# Patient Record
Sex: Female | Born: 1985
Health system: Southern US, Community
[De-identification: ages and names within clinical notes are randomized; demographics above are authoritative.]

## PROBLEM LIST (undated history)

## (undated) ENCOUNTER — Inpatient Hospital Stay (HOSPITAL_COMMUNITY): Payer: Self-pay

## (undated) HISTORY — PX: TUBAL LIGATION: SHX77

## (undated) HISTORY — PX: TIBIA FRACTURE SURGERY: SHX806

---
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
## 2005-06-19 ENCOUNTER — Emergency Department (HOSPITAL_COMMUNITY): Admission: EM | Admit: 2005-06-19 | Discharge: 2005-06-19 | Payer: Self-pay | Admitting: Emergency Medicine

## 2005-12-02 ENCOUNTER — Emergency Department (HOSPITAL_COMMUNITY): Admission: EM | Admit: 2005-12-02 | Discharge: 2005-12-02 | Payer: Self-pay | Admitting: Emergency Medicine
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
## 2006-03-12 ENCOUNTER — Inpatient Hospital Stay (HOSPITAL_COMMUNITY): Admission: AD | Admit: 2006-03-12 | Discharge: 2006-03-12 | Payer: Self-pay | Admitting: Obstetrics & Gynecology

## 2006-04-17 ENCOUNTER — Inpatient Hospital Stay (HOSPITAL_COMMUNITY): Admission: AD | Admit: 2006-04-17 | Discharge: 2006-04-18 | Payer: Self-pay | Admitting: Obstetrics and Gynecology

## 2006-06-22 ENCOUNTER — Inpatient Hospital Stay (HOSPITAL_COMMUNITY): Admission: AD | Admit: 2006-06-22 | Discharge: 2006-06-22 | Payer: Self-pay | Admitting: Obstetrics and Gynecology

## 2006-08-18 ENCOUNTER — Inpatient Hospital Stay (HOSPITAL_COMMUNITY): Admission: AD | Admit: 2006-08-18 | Discharge: 2006-08-18 | Payer: Self-pay | Admitting: Obstetrics and Gynecology

## 2006-08-31 ENCOUNTER — Inpatient Hospital Stay (HOSPITAL_COMMUNITY): Admission: AD | Admit: 2006-08-31 | Discharge: 2006-08-31 | Payer: Self-pay | Admitting: Obstetrics and Gynecology

## 2006-09-08 ENCOUNTER — Observation Stay (HOSPITAL_COMMUNITY): Admission: AD | Admit: 2006-09-08 | Discharge: 2006-09-09 | Payer: Self-pay | Admitting: Obstetrics and Gynecology

## 2006-09-09 IMAGING — US US OB LIMITED
1 series · 14 of 18 positions shown · non-contrast
Comparison: none

OBSTETRICAL ULTRASOUND:

 This ultrasound exam was performed in the [HOSPITAL] Ultrasound Department.  The OB US report was generated in the AS system, and faxed to the ordering physician.  This report is also available in [REDACTED] PACS.

[Series 1: us ob limited · 0.30mm/px · 14 of 18 slices shown]
[im 1/18]
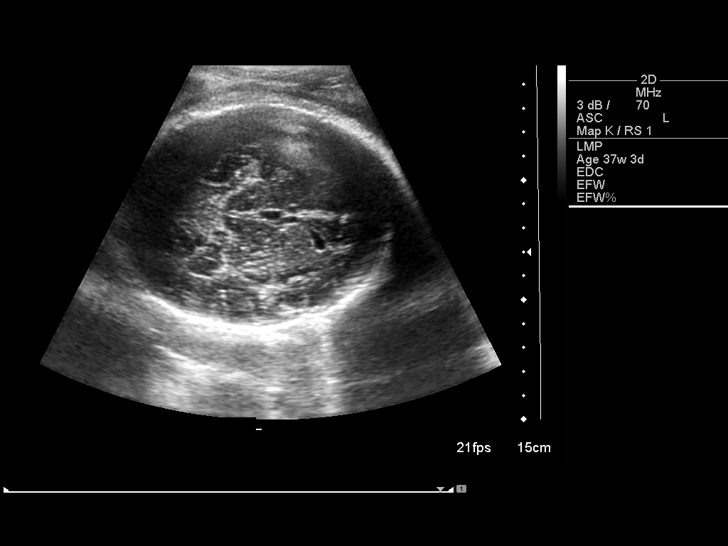
[im 2/18]
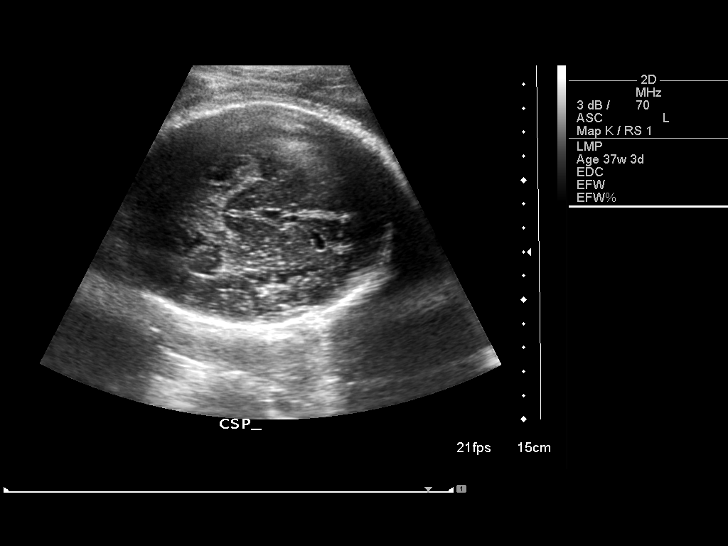
[im 4/18]
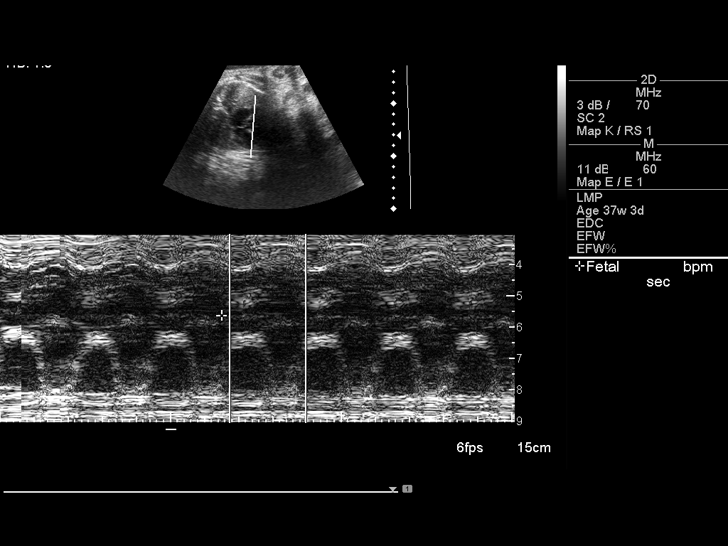
[im 5/18]
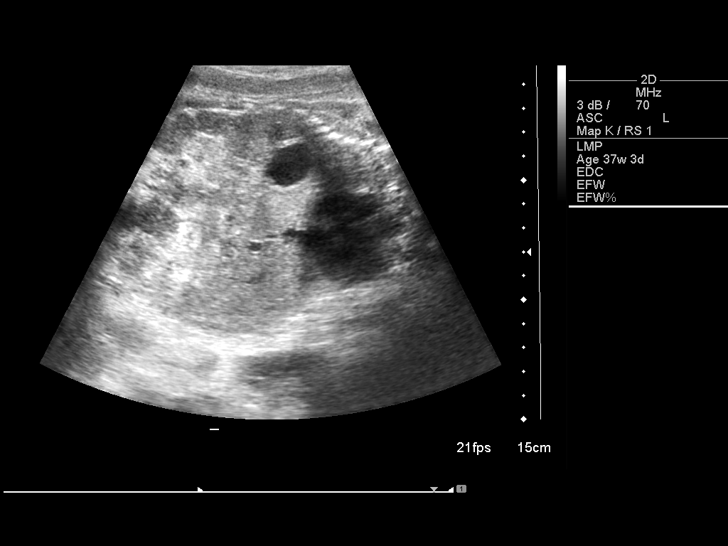
[im 6/18]
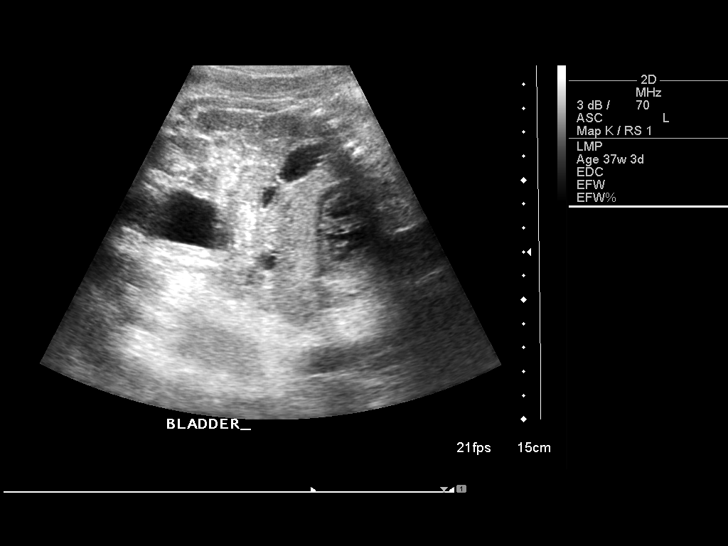
[im 8/18]
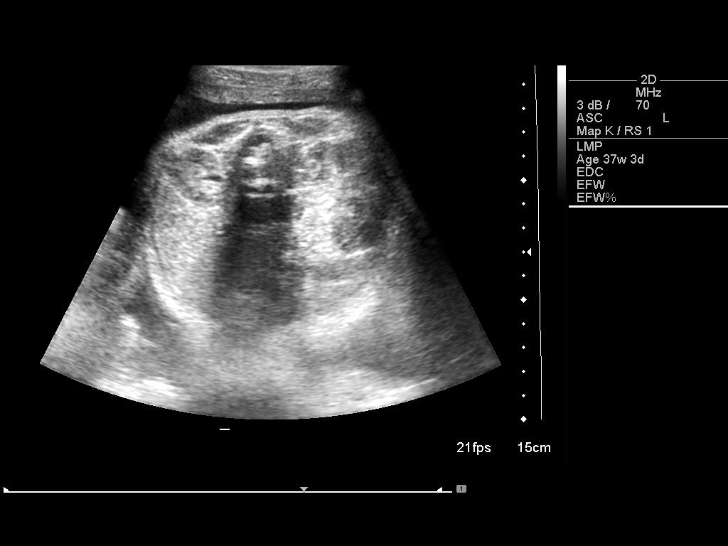
[im 9/18]
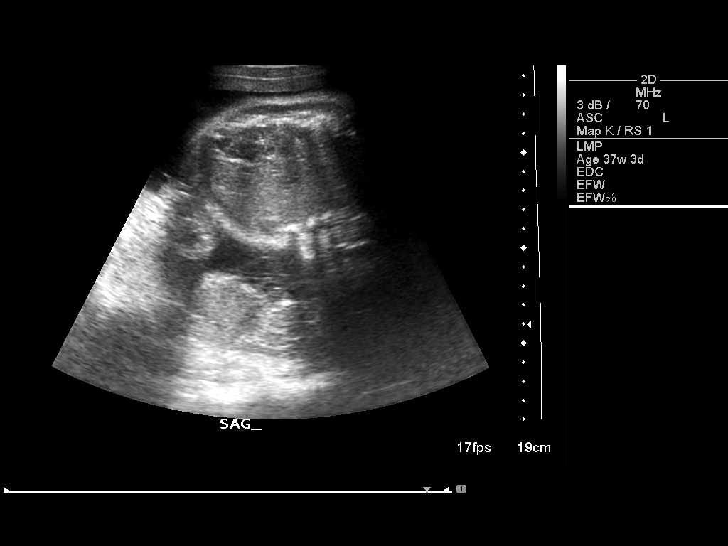
[im 10/18]
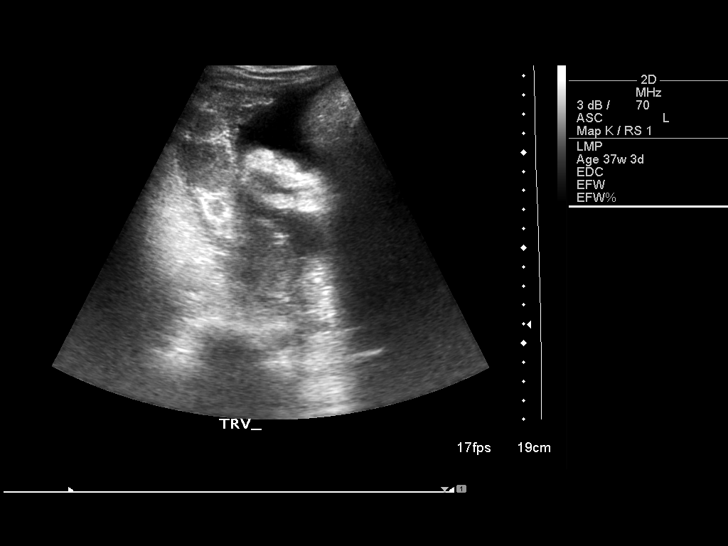
[im 11/18]
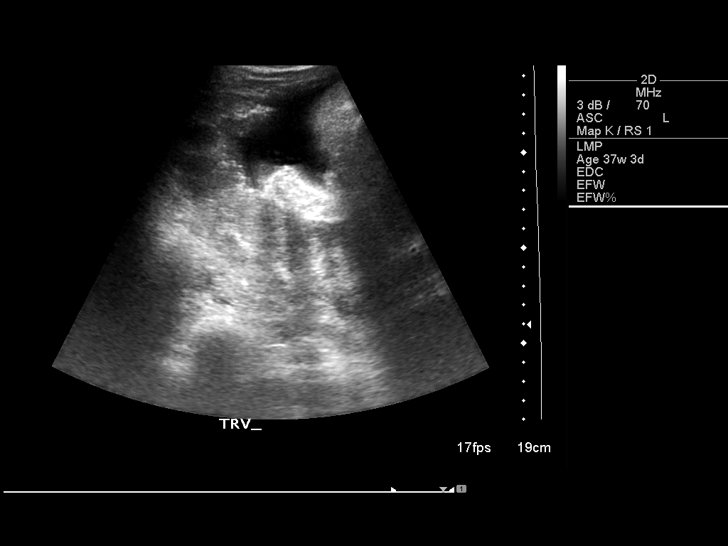
[im 13/18]
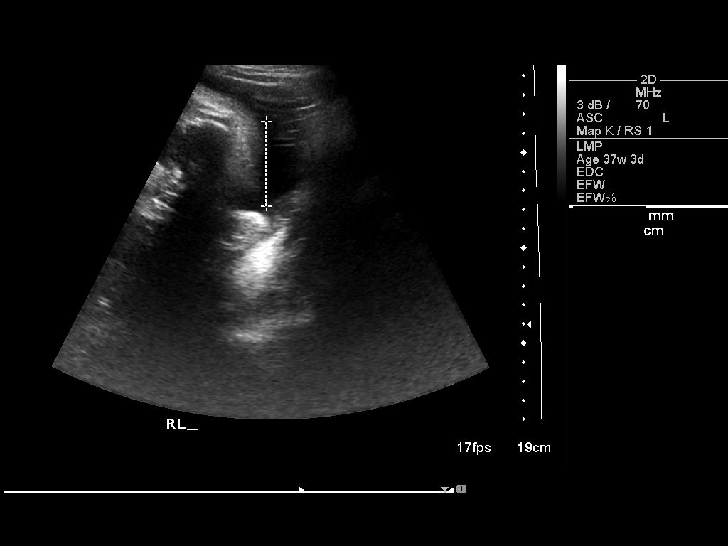
[im 14/18]
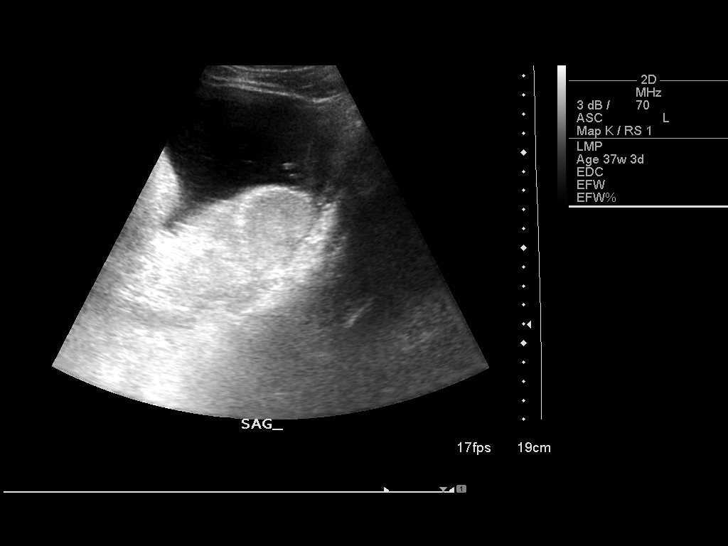
[im 15/18]
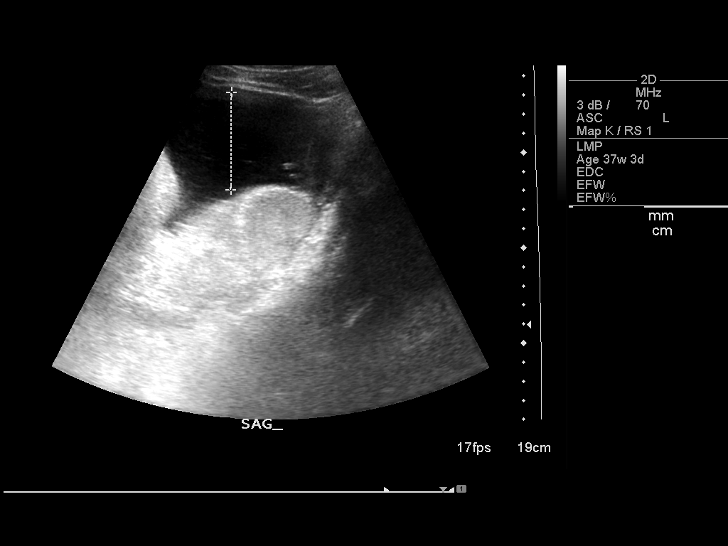
[im 17/18]
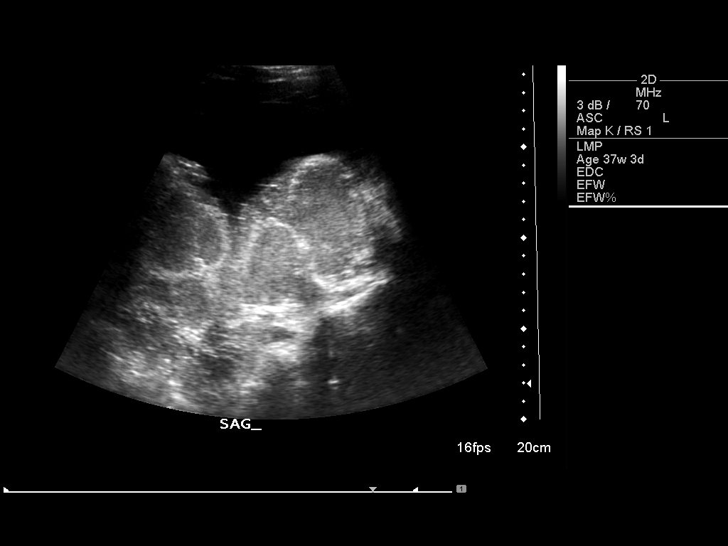
[im 18/18]
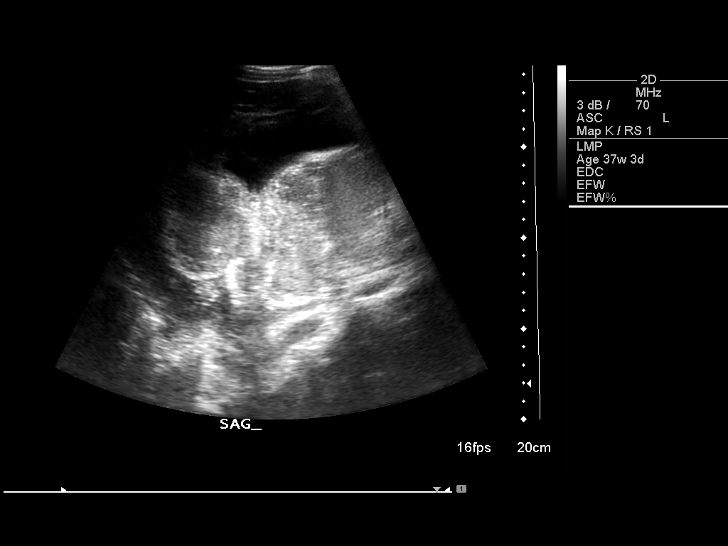

[14 of 18 positions shown; findings below may reference images not displayed]

IMPRESSION: See AS Obstetric US report.

## 2006-09-30 ENCOUNTER — Inpatient Hospital Stay (HOSPITAL_COMMUNITY): Admission: AD | Admit: 2006-09-30 | Discharge: 2006-09-30 | Payer: Self-pay | Admitting: Obstetrics and Gynecology

## 2006-09-30 IMAGING — US US FETAL BPP W/O NONSTRESS
1 series · 14 of 24 positions shown · non-contrast
Comparison: none

OBSTETRICAL ULTRASOUND:

 This ultrasound exam was performed in the [HOSPITAL] Ultrasound Department.  The OB US report was generated in the AS system, and faxed to the ordering physician.  This report is also available in [REDACTED] PACS.

[Series 1: us fetal bpp w/o nonstress · non-contrast · 24 acquisitions, 14 frames shown]
[im 1/24]
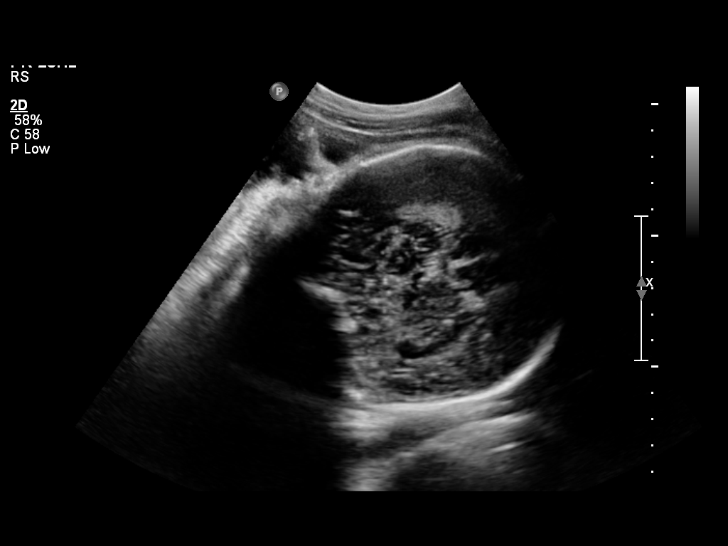
[im 3/24]
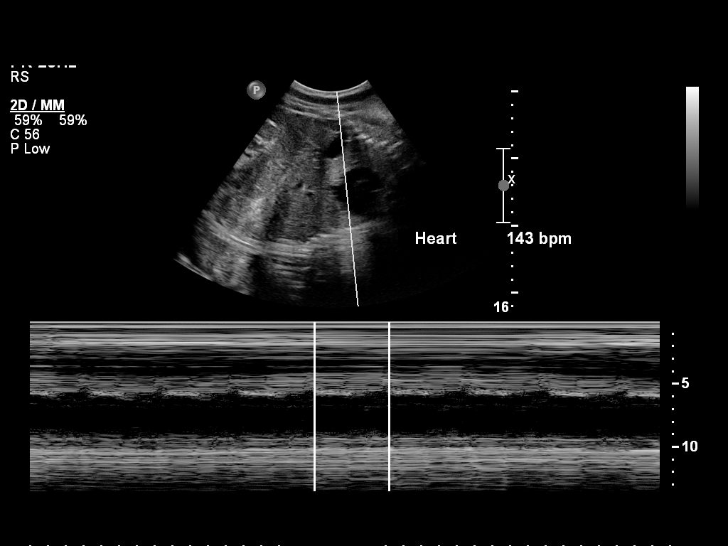
[im 5/24]
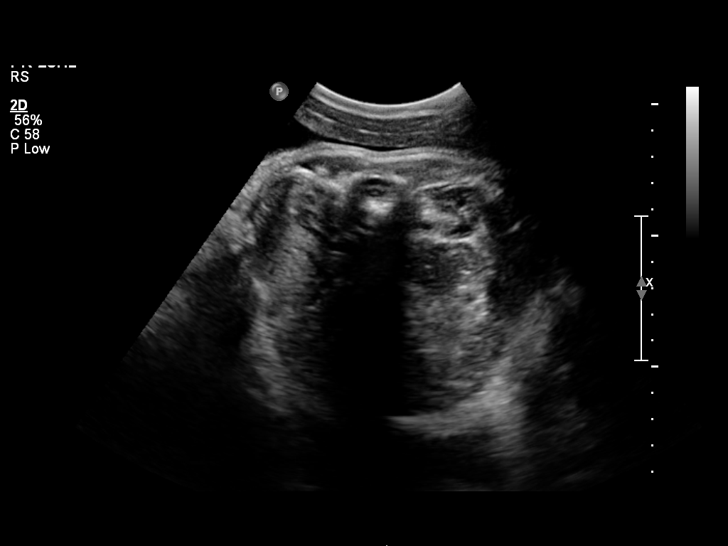
[im 7/24]
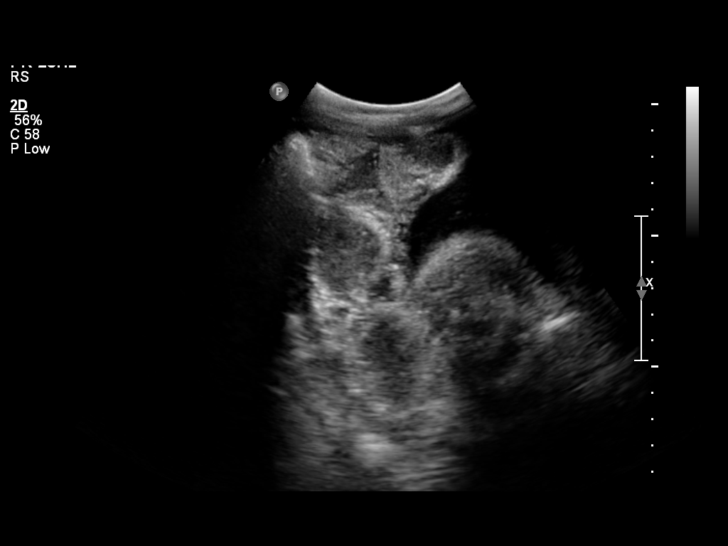
[im 8/24]
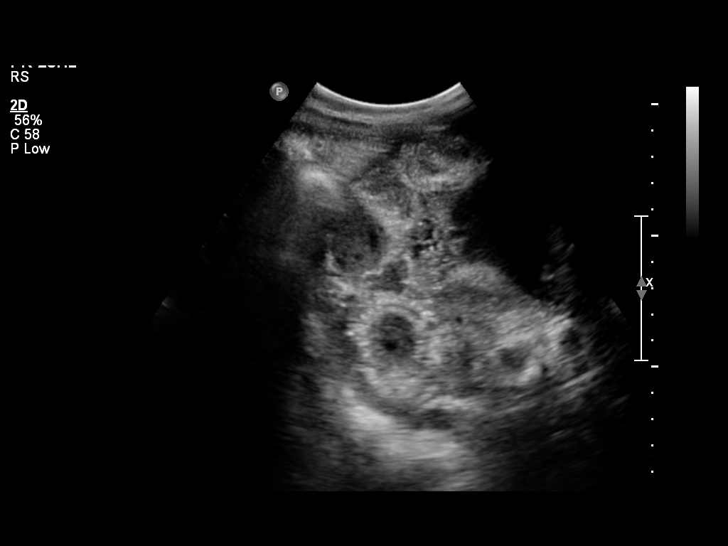
[im 10/24]
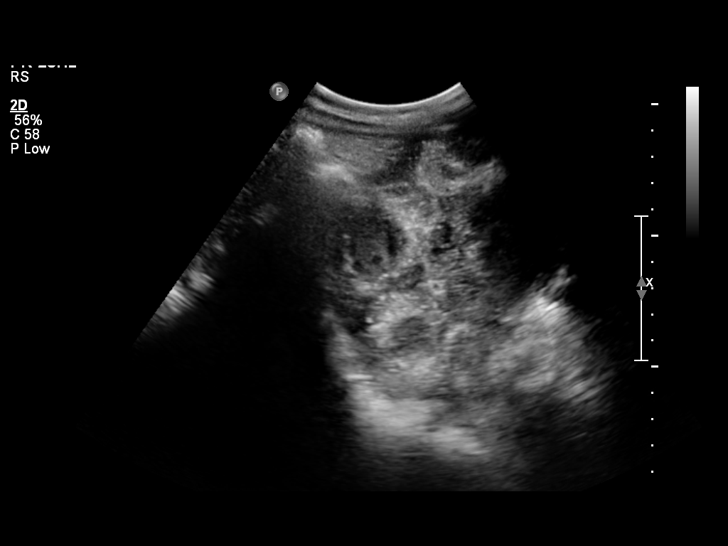
[im 12/24]
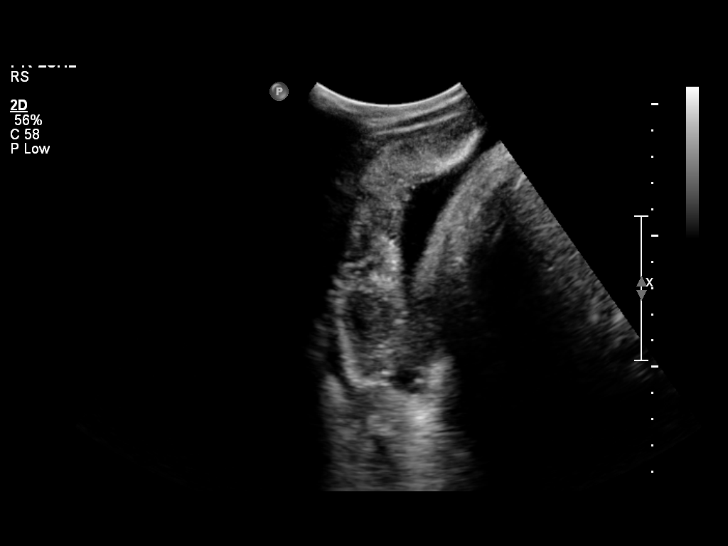
[im 13/24]
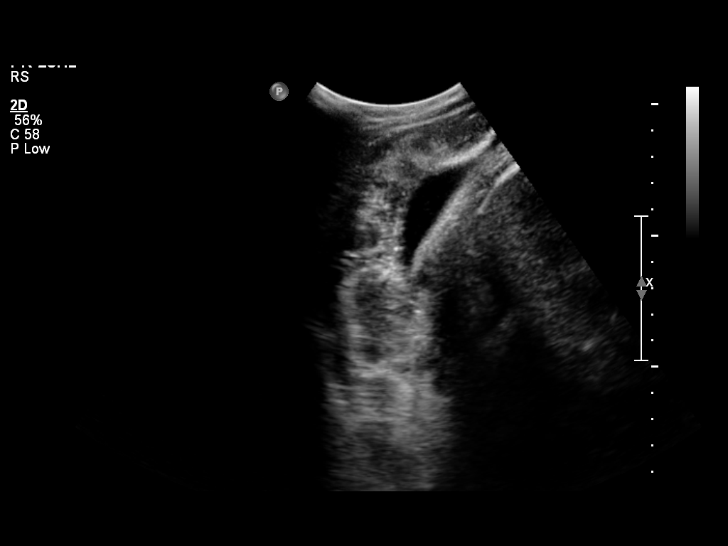
[im 15/24]
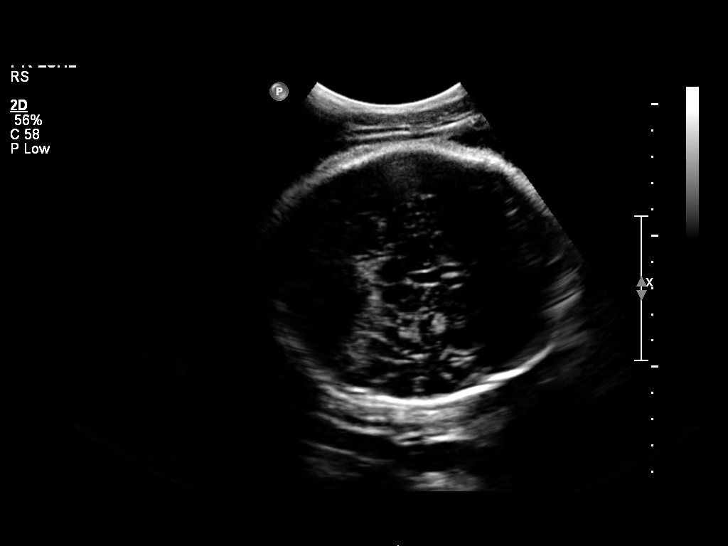
[im 17/24]
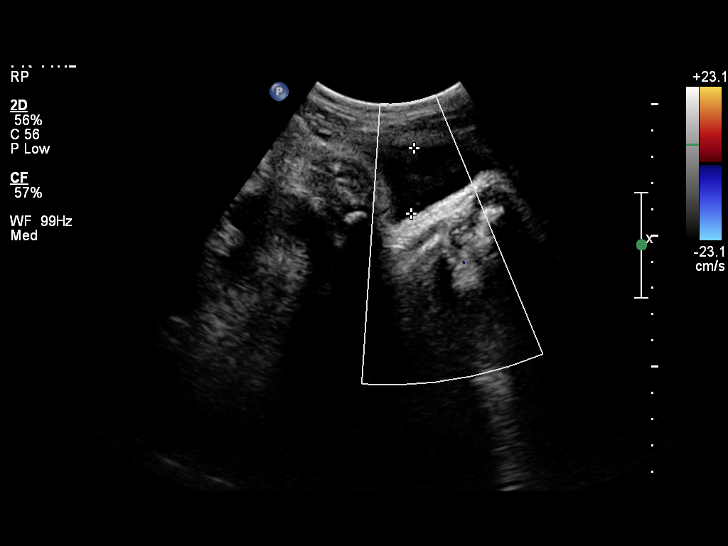
[im 19/24]
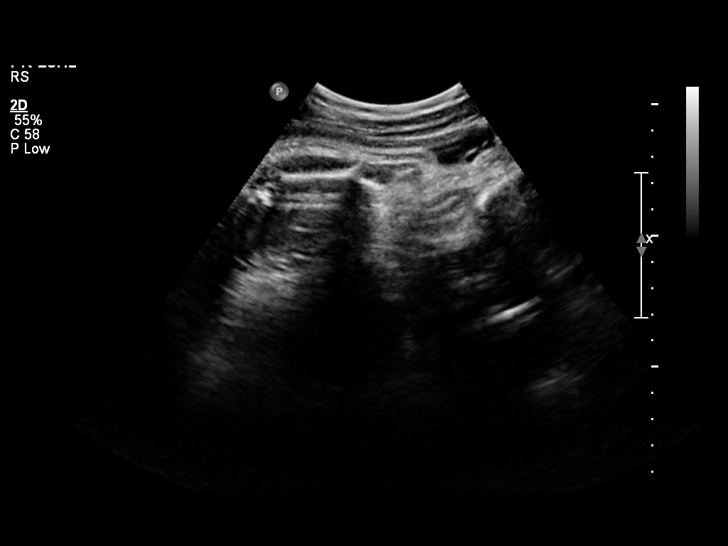
[im 20/24]
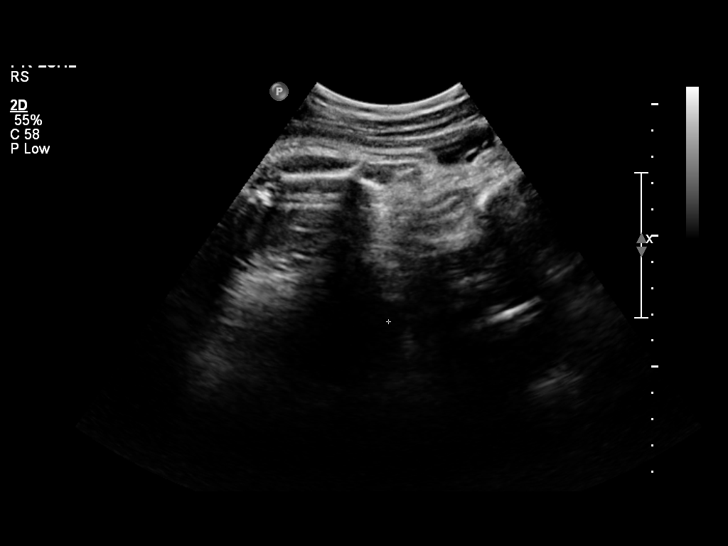
[im 22/24]
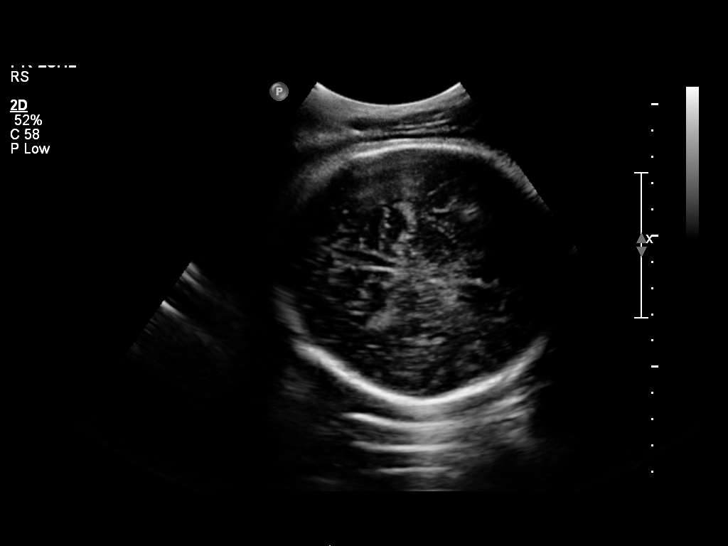
[im 24/24]
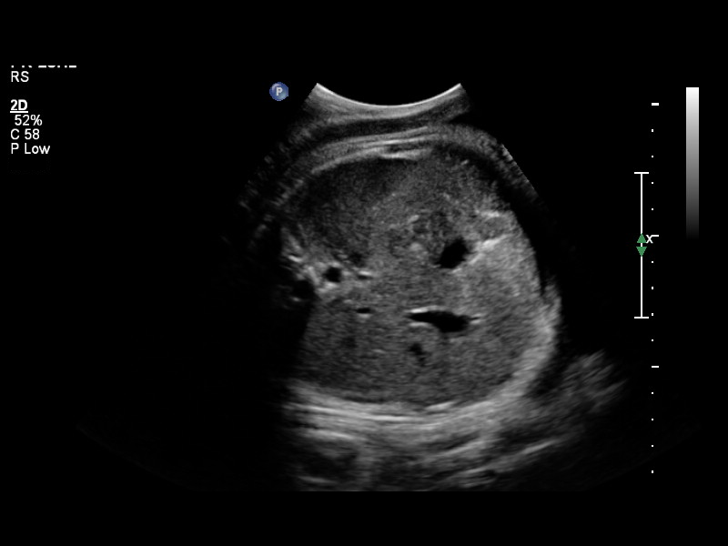

[14 of 24 positions shown; findings below may reference images not displayed]

IMPRESSION: See AS Obstetric US report.

## 2006-10-05 ENCOUNTER — Inpatient Hospital Stay (HOSPITAL_COMMUNITY): Admission: AD | Admit: 2006-10-05 | Discharge: 2006-10-05 | Payer: Self-pay | Admitting: Obstetrics and Gynecology

## 2006-10-06 ENCOUNTER — Inpatient Hospital Stay (HOSPITAL_COMMUNITY): Admission: AD | Admit: 2006-10-06 | Discharge: 2006-10-09 | Payer: Self-pay | Admitting: Obstetrics and Gynecology

## 2006-10-07 ENCOUNTER — Encounter (INDEPENDENT_AMBULATORY_CARE_PROVIDER_SITE_OTHER): Payer: Self-pay | Admitting: Obstetrics and Gynecology

## 2007-04-23 ENCOUNTER — Emergency Department (HOSPITAL_COMMUNITY): Admission: EM | Admit: 2007-04-23 | Discharge: 2007-04-23 | Payer: Self-pay | Admitting: Emergency Medicine

## 2007-04-25 ENCOUNTER — Emergency Department (HOSPITAL_COMMUNITY): Admission: EM | Admit: 2007-04-25 | Discharge: 2007-04-25 | Payer: Self-pay | Admitting: Emergency Medicine

## 2010-10-05 NOTE — Discharge Summary (Signed)
NAMELYRICK, WORLAND              ACCOUNT NO.:  [id number]   MEDICAL RECORD NO.:  [id number]          PATIENT TYPE:  INP   LOCATION:  9129                          FACILITY:  [facility]   PHYSICIAN:  Crist Fat. Rivard, M.D. DATE OF BIRTH:  1986-04-13   DATE OF ADMISSION:  10/06/2006  DATE OF DISCHARGE:  10/09/2006                               DISCHARGE SUMMARY   ADMISSION DIAGNOSES:  1. Intrauterine pregnancy at 40-1/7 weeks.  2. Pregerminal level with fatigue.   DISCHARGE DIAGNOSES:  1. Intrauterine pregnancy at term.  2. Rested dilation.  3. Thick meconium.  4. Body cord.  5. Chorioamnionitis.   PROCEDURES:  Primary low tranverse Cesarean section.   HOSPITAL COURSE:  [name] is a 25 year old Gravida 1, Para 0 at 79-  1/7 weeks who presented with prolonged pregerminal labor on the  afternoon of Oct 06, 2006.  The pregnancy had been remarkable for:  1.  Preterm contractions, and 2)  Positive Group B Strep.   On admission, the cervix was 2-3, 85-90% with a vertex at the minus 2  station.  Contractions were every 2-5 minutes.  Fetal monitor showed  nonreactive fetal heart rate with early deceleration.  Penicillin  prophylaxis was begun.  Pitocin augmentation was also begun.  Epidural  was placed.  Artificial ruptured membranes was accomplished the  afternoon of Oct 06, 2006, where the cervix was 4-5, 100% vertex at a  minus 2 station.  Thick meconium-stained fluid was noted.  An IUPC was  placed, and amnio infusion was started.  Vital signs were within normal  limits.  About 8:30 p.m. the patient had had  deceleration x2 minutes to  the 100s with good recovery.  It occurred to upset her nausea and  vomiting.  She did have some labile blood pressures.  Urine was negative  for protein.  At 9:50 p.m., blood pressure was 140/91.  Fetal heart rate  was reactive.  Monitor __________ were showing 150.  Cervix was 6, 100%  vertex at a minus 2 station with some swelling noted.  Pitocin  was  continued to help to achieve adequate __________ unit.  At 11 p.m., the  cervix had not changed.  Fetal heart rate was showing some occasional  light decelerations and deep variables.  NST tended to become non  reassuring when Pitocin was started after discontinuance.  Decision was  made to discontinue the Pitocin and proceed with C-section.  The patient  and her family were in agreement with this plan.   The patient was taken to the operating room by Dr. Normand Sloop where a  primary low-transverse Casserian section with a 2-layer closure was  performed under existing epidural anesthesia.  Findings were a viable  female, weight 8 pounds, 12 ounces.  APGARs were 4 and 7.  RTO pH was  7.18.  Venous was 7.26.  There was thick meconium noted.  The patient  was taken to recovery in good condition.  Infant was taken to the full-  term nursery.   On late postop Day 0, the patient was doing well; however, late that  evening,  she spiked a temperature of 101.0.  She did have some uterine  tenderness.  The uterus was firm.  No other etiology of infection was  noted.  Unasyn was begin.  CBC was done showing hemoglobin of 8.3 down  from 9.4, white blood cell count of 13 up from 8.2, and platelet count  of 240.  Tylenol was given p.r.n. for temperature.  By postop Day 1, the  patient was doing well.  She was bottle feeding.  She had a temperature  max of 101.8 at 9:20 p.m. on the evening of May 17.  She had been  afebrile since that time after Unasyn had been begun.  Urine culture was  sent.  Physical exam was within normal limits.  The patient was up ab  lib without any syncope or dizziness.  By postop Day 2, the patient was  continuing to do well.  She did desire discharge.  She had no dizziness,  syncope, chills, or fever.  She had been afebrile for 36 hours.  Her  physical exam was within normal limits.  Her incision looked clean, dry,  and intact.  Her abdomen was soft; it was appropriately  mildly tender.  Urine culture was not resulted yet.  Dr. Estanislado Pandy was consulted about the  patient's desire for discharge, and the decision was made to send her  home.   DISCHARGE INSTRUCTIONS:  The patient is to take her temperature twice a  day for 2 days, and to report any temperature greater than or equal to  100.4.  Discharge instructions will also include [facility]  handout.   DISCHARGE MEDICATIONS:  Motrin 600 mg p.o. q.6h. p.r.n. pain, Percocet  5/325 one to two p.o. q.3-4h. p.r.n. pain, Ortho-Tri-Cyclen low 1 p.o.  daily which she may start in 1-2 weeks.   FOLLOW UP:  Discharge follow up will occur in 6 weeks at the [facility] or p.r.n.      Renaldo Reel Emilee Hero, C.N.M.      Crist Fat Rivard, M.D.  Electronically Signed    VLL/MEDQ  D:  10/09/2006  T:  10/09/2006  Job:  [id number]

## 2010-10-05 NOTE — H&P (Signed)
NAMELAPORSCHE, HOEGER              ACCOUNT NO.:  [id number]   MEDICAL RECORD NO.:  [id number]          PATIENT TYPE:  INP   LOCATION:  9167                          FACILITY:  [facility]   PHYSICIAN:  Naima A. Dillard, M.D. DATE OF BIRTH:  [birth year]   DATE OF ADMISSION:  10/06/2006  DATE OF DISCHARGE:                              HISTORY & PHYSICAL   This is a 25 year old gravida 1, para 0 at 40-1/7 weeks who presents  with contractions every 2 to 5 minutes for 24 hours. She has been  vomiting last night and unable to sleep despite Ambien. Pregnancy has  been followed by Dr. Pennie Rushing and remarkable for:  1. Preterm contractions.  2. Group B strep positive.   ALLERGIES:  None.   OBSTETRICAL HISTORY:  The patient is a primigravida.   MEDICAL HISTORY:  Remarkable for childhood varicella.   SURGICAL HISTORY:  Remarkable for surgery to repair a broken tibia in  2001.   FAMILY HISTORY:  Remarkable for an aunt with hypertension, mother with  diabetes and aunt with breast cancer.   GENETIC HISTORY:  Remarkable for an uncle with mental retardation and  father of the baby who is a twin.   SOCIAL HISTORY:  The patient is single. Father of the baby, Demond  Hamlett, is involved and supportive. She is of the [employer]. She  denies any alcohol, tobacco or drug use.   PRENATAL LABORATORY DATA:  Hemoglobin 10.9, platelets 319, blood type O  positive, antibiotic screen negative, sickle cell negative, RPR  nonreactive, rubella immune, hepatitis negative, HIV negative, Pap test  normal, gonorrhea negative, Chlamydia negative, cystic fibrosis  negative.   HISTORY OF CURRENT PREGNANCY:  The patient entered care at 36 weeks'  gestation. She had an ultrasound to confirm dates. She had another  ultrasound at 19 weeks which was normal. Quad screen was normal. Glucola  at 28 weeks was 118. She had some contractions at 32 weeks, had a  negative fetal fibronectin and 50% effacement but no dilation.  She was  placed on terbutaline for p.r.n. use. She came up with a group B strep  that was positive at 33 weeks and was treated with penicillin secondary  to persistent contractions, and she presents today in labor.   OBJECTIVE DATA:  VITAL SIGNS:  Stable, afebrile.  HEENT:  Within normal limits. Thyroid normal and not enlarged.  CHEST:  Clear to auscultation.  HEART:  Regular rate and rhythm.  ABDOMEN:  Gravid. Vertex Leopold's. CFM shows nonreactive fetal heart  rate with early decelerations. Uterine contractions are every 2 to 5  minutes. Cervix is 2 to 3, 85-90% effaced and -2 station, vertex  presentation.  EXTREMITIES:  Within normal limits.   ASSESSMENT:  1. Intrauterine pregnancy at 40-1/2 weeks.  2. Prodromal labor with fatigue.   PLAN:  1. Admit per Dr. Normand Sloop (the patient was scheduled for induction      tomorrow morning).  2. Routine M.D. orders.  3. Penicillin prophylaxis.  4. Pitocin augmentation.  5. Call M.D. after Pitocin was started.      Marie L. Williams, C.N.M.  Naima A. Normand Sloop, M.D.  Electronically Signed    MLW/MEDQ  D:  10/06/2006  T:  10/06/2006  Job:  [id number]

## 2010-10-05 NOTE — Op Note (Signed)
Felicia Vasquez, Felicia Vasquez              ACCOUNT NO.:  [id number]   MEDICAL RECORD NO.:  [id number]          PATIENT TYPE:  INP   LOCATION:  9129                          [facility]ACILITY[facility]   PHYSICIAN:  Felicia A. Dillard, M.D. DATE OF BIRTH:  04-24-1986   DATE OF PROCEDURE:  10/07/2006  DATE OF DISCHARGE:                               OPERATIVE REPORT   PREOPERATIVE DIAGNOSES:  1. Intrauterine pregnancy at term.  2. Arrested dilation.  3. Thick meconium.  4. Nonreassuring heart tones.   POSTOPERATIVE DIAGNOSES:  1. Intrauterine pregnancy at term.  2. Arrested dilation.  3. Thick meconium.  4. Nonreassuring heart tones.  5. Body cord around the baby.   PROCEDURE:  Primary low transverse Cesarean section with 2 layer  closure.   ANESTHESIA:  Epidural.   SURGEON:  Dr. Normand Vasquez.   ASSISTANT:  Marie L. Williams, C.N.M.   ESTIMATED BLOOD LOSS:  500 mL.   URINE OUTPUT:  310 mL clear urine at the end of the procedure.   INTRAVENOUS FLUIDS:  1800 mL crystalloid.   There were no complications. Placenta was sent to pathology.   FINDINGS:  Female infant in vertex presentation with thick meconium with a  body cord, born at 58, Apgars of 4 and 7 with a weight of 4 pounds 8  ounces. Arterial cord pH was 7.18. Venous cord pH was 7.26. Normal-  appearing anatomy. The patient went to the recovery room in stable  condition.   PROCEDURE IN DETAIL:  The patient was taken to the operating room where  her epidural anesthesia was found to be adequate. She was in dorsal  supine position with a left lateral tilt. A Pfannenstiel skin incision  was made with the scalpel and carried down to the fascia, using the  Bovie cautery, the fascia was incised in the midline and extended  bilaterally. Kochers x2 placed in the superior aspect of the fascia  which was dissected off of the rectus muscle both sharply and bluntly.  The inferior aspect of the fascia was dissected in a similar fashion.  Rectus muscles  were separated in the midline. Peritoneum was identified,  tented up and entered sharply and extended sharply with good  visualization of bowel and bladder. The bladder blade was inserted. The  vesicouterine peritoneum was identified, tented up and entered sharply  and dissected on both sides. A bladder flap was created digitally and  bladder blade was reinserted. Primary low transverse uterine incision  was made with the scalpel and extended bluntly. The infant's head was  delivered without difficulty. Baby did have kind of a body/shoulder cord  which was reduced easily x1. Mouth and nares were suctioned with DeLee  suction. Body was delivered. Cord was clamped and cut. The infant was  handed over to the waiting pediatrician. The infant had green meconium  all over his body. Cord gases were obtained. The placenta was delivered.  The uterus was cleared of all clots and debris. The uterine incision was  repaired with 0 Vicryl in a running locked fashion. A second layer of 0  Vicryl was used to imbricate  the uterus. There was a little bit of  oozing from the bladder flap which was made hemostatic with Bovie  cautery and Gelfoam. Irrigation was done. Hemostasis was noted. The  patient had normal-appearing uterus, tubes and ovaries and normal-  appearing abdominal anatomy. The peritoneum was closed with 0 chromic.  The muscles were irrigated and noted to be hemostatic. The fascia was  closed with 0 Vicryl in a running fashion. The subcutaneous layer was  closed with 2-0 plain, and the skin was closed with 3-0 Monocryl in a  subcuticular fashion. Sponge, lap and needle counts were correct. The  patient went to the recovery room in stable condition.      Felicia Vasquez, M.D.  Electronically Signed     NAD/MEDQ  D:  10/07/2006  T:  10/07/2006  Job:  [id number]

## 2010-10-08 ENCOUNTER — Inpatient Hospital Stay (INDEPENDENT_AMBULATORY_CARE_PROVIDER_SITE_OTHER)
Admission: RE | Admit: 2010-10-08 | Discharge: 2010-10-08 | Disposition: A | Discharge status: Home / Self Care | Payer: 59 | Source: Ambulatory Visit | Attending: Emergency Medicine | Admitting: Emergency Medicine

## 2010-10-08 DIAGNOSIS — J029 Acute pharyngitis, unspecified: Secondary | ICD-10-CM

## 2010-10-08 LAB — POCT PREGNANCY, URINE: Preg Test, Ur: NEGATIVE

## 2010-10-08 NOTE — H&P (Signed)
Felicia Vasquez, Felicia Vasquez              ACCOUNT NO.:  [id number]   MEDICAL RECORD NO.:  [id number]          PATIENT TYPE:  OBV   LOCATION:  9149                          [facility]ILITY[facility]   PHYSICIAN:  Hal Morales, M.D.DATE OF BIRTH:  1985-07-05   DATE OF ADMISSION:  09/08/2006  DATE OF DISCHARGE:                              HISTORY & PHYSICAL   This is a 25 year old gravida 1, para 0 at 36-0/7 weeks who presents  status post a fall onto her abdomen and hands at home.  She was vomiting  and felt faint and then fell forward.  There is positive fetal movement  and no bleeding.  The pregnancy has been followed by Dr. Pennie Rushing and  remarkable for:  1)  Group B strep positive.  2)  Preterm labor.  3)  Third trimester UTI.   ALLERGIES:  None.   OB HISTORY:  Negative.   PAST MEDICAL HISTORY:  Remarkable for childhood Varicella.   PAST SURGICAL HISTORY:  Remarkable for surgery on her tibia in 2001.   FAMILY HISTORY:  Remarkable for an aunt with hypertension, mother with  diabetes, and aunt with breast cancer.   GENETIC HISTORY:  Remarkable for uncle with mental retardation.  Father  of the baby is a twin.   SOCIAL HISTORY:  Patient is single.  Father of the baby, Damon Hamick,  is involved and supportive.  She is of the [employer].  She denies  any alcohol, tobacco, or drug use.   PRENATAL LABS:  Hemoglobin 10.9, platelets 319.  Blood type O+.  Antibody screen negative.  Sickle cell negative.  RPR nonreactive.  Rubella immune.  Hepatitis negative.  HIV negative.  Pap test normal.  Gonorrhea negative.  Chlamydia negative.  Cystic fibrosis negative.   HISTORY OF CURRENT PREGNANCY:  Patient entered care at [redacted] weeks  gestation.  She had an ultrasound at that time to determine the dates.  She had anemia at the beginning of the pregnancy, treated with prenatal  vitamins.  She had a quad screen done that was normal.  A 28-week  Glucola was normal.  She had some contractions at 32 weeks  with a  negative fetal fibronectin.  She was treated with penicillin for  persistent contractions.  Group B strep was positive at term.  She was  treated for a UTI at term.   OBJECTIVE DATA:  VITAL SIGNS:  Vital signs stable.  Afebrile.  HEENT:  Within normal limits.  NECK: Thyroid normal, not enlarged.  CHEST:  Clear to auscultation.  HEART:  Regular rate and rhythm.  ABDOMEN:  Gravid, soft.  Somewhat tender throughout with increased  tenderness over the left lower quadrant.  Fetal monitor shows baseline  heart rate of 140s with accelerations from 150 to 155.  Uterine  contractions every 1-1/2 to 4 minutes.  Cervix is 1.5 cm, 75% effaced, -  2 station with a vertex presentation.  There is no vaginal bleeding.  EXTREMITIES:  Within normal limits.   ASSESSMENT:  1. Intrauterine pregnancy at 36-0/7 weeks.  2. Status post fall.  3. Contractions.   PLAN:  1. Admit to antenatal, per Dr. Pennie Rushing.  2. Ultrasound to check placenta.  3. KLB and CBC.  4. Stadol for analgesia.      Marie L. Williams, C.N.M.      Hal Morales, M.D.  Electronically Signed    MLW/MEDQ  D:  09/09/2006  T:  09/09/2006  Job:  [id number]

## 2010-10-08 NOTE — Discharge Summary (Signed)
Felicia Vasquez, Felicia Vasquez              ACCOUNT NO.:  [id number]   MEDICAL RECORD NO.:  [id number]          PATIENT TYPE:  OBV   LOCATION:  9149                          [facility]   PHYSICIAN:  Hal Morales, M.D.DATE OF BIRTH:  Oct 29, 1985   DATE OF ADMISSION:  09/08/2006  DATE OF DISCHARGE:  09/09/2006                               DISCHARGE SUMMARY   ADMISSION DIAGNOSES:  1. Intrauterine pregnancy at 43 and 0/7 weeks.  2. Status post fall.  3. Uterine contractions.   DISCHARGE DIAGNOSES:  1. Intrauterine pregnancy at 64 and 0/7 weeks.  2. Status post fall.  3. Rare uterine contractions.   HOSPITAL COURSE:  [name] is a 25 year old, prima gravida who  presented to maternity admissions status post fall onto abdomen and  hands when she was at home.  The patient stated she had been vomiting  and felt faint, and then fell forward and fell.  This happened at 9:00  p.m. on September 08, 2006.  She reported positive fetal movement with no  bleeding afterwards.  Her pregnancy has been followed by the [facility] MD service, and this is remarkable for:  1. Positive group B strep.  2. Preterm contractions.  3. Third trimester urinary tract infection.   Upon admission, fetal heart rate was in the 140's with accelerations to  150-155.  She was having uterine contractions every one and a half to  four minutes.  Her abdomen was gravid and soft, tender over her left  lower quadrant, somewhat tender throughout.  Cervix was 1.5 cm and 75  percent effaced with vertex at the minus 2 station with no bleeding.  CBC upon admission with hemoglobin of 8.7, hematocrit was 26.1.  Platelets were 256,000.  White blood cell count was 7.8.  Kleihauer-  Betke was 0.  Urinalysis- specific gravity was less than 1.005, moderate  leukocytes, many squamous epithelial cells, few bacteria.  Ultrasound  showed a cephalic presentation, left lateral placenta, AFI at the 61st  percent at 16.3 cm.   On April 19, she was complaining of nausea and  previous diarrhea, though the patient had no vomiting or diarrhea while  in the hospital.  Fetal heart rate remained intermittently reactive with  rare contractions.  Due to her hemoglobin of 8.7, and the fact that it  had been 10.6 on March 12, 2006, she had anemia workup that was done.  By the time of discharge, the only components of that that were  completed were the technologist's smear showing normal WBCs, RBCs, and  platelets, and the reticulocyte count, which was within normal limits at  2.4 percent.  The RBCs were normal at 2.91 and absolute reticulocytes  were 69.8, all within normal limits.  The patient desired to be  discharged home later in the evening on April 19, giving her a full 24  hours of monitoring.  She is sent home with labor precautions as well.  Discharge followup will occur at [facility] next week.   DISCHARGE MEDICATIONS:  1. Reglan 10 mg one p.o. every six hours.  2. Zantac 300  mg one p.o. b.i.d.  3. Prenatal vitamin one p.o. daily.  4. Iron one p.o. daily.   DISCHARGE INSTRUCTIONS:  Preterm labor precautions.   DISCHARGE FOLLOWUP:  Will occur at [facility] next week.      Cam Hai, C.N.M.      Hal Morales, M.D.  Electronically Signed    KS/MEDQ  D:  09/09/2006  T:  09/10/2006  Job:  [id number]

## 2011-02-28 LAB — STREP A DNA PROBE: Group A Strep Probe: NEGATIVE

## 2011-02-28 LAB — RAPID STREP SCREEN (MED CTR MEBANE ONLY): Streptococcus, Group A Screen (Direct): NEGATIVE

## 2011-06-20 ENCOUNTER — Encounter (HOSPITAL_COMMUNITY): Payer: Self-pay | Admitting: *Deleted

## 2011-06-20 ENCOUNTER — Emergency Department (HOSPITAL_COMMUNITY)
Admission: EM | Admit: 2011-06-20 | Discharge: 2011-06-20 | Disposition: A | Discharge status: Home / Self Care | Payer: BC Managed Care – PPO | Attending: Emergency Medicine | Admitting: Emergency Medicine

## 2011-06-20 ENCOUNTER — Other Ambulatory Visit: Payer: Self-pay

## 2011-06-20 DIAGNOSIS — R110 Nausea: Secondary | ICD-10-CM | POA: Insufficient documentation

## 2011-06-20 DIAGNOSIS — R51 Headache: Secondary | ICD-10-CM | POA: Insufficient documentation

## 2011-06-20 DIAGNOSIS — R42 Dizziness and giddiness: Secondary | ICD-10-CM | POA: Insufficient documentation

## 2011-06-20 LAB — POCT I-STAT, CHEM 8
Chloride: 104 mEq/L (ref 96–112)
Glucose, Bld: 107 mg/dL — ABNORMAL HIGH (ref 70–99)
Hemoglobin: 12.6 g/dL (ref 12.0–15.0)
Potassium: 3.9 mEq/L (ref 3.5–5.1)

## 2011-06-20 LAB — CBC
HCT: 34.1 % — ABNORMAL LOW (ref 36.0–46.0)
Hemoglobin: 11.5 g/dL — ABNORMAL LOW (ref 12.0–15.0)
MCH: 29.5 pg (ref 26.0–34.0)
MCHC: 33.7 g/dL (ref 30.0–36.0)
MCV: 87.4 fL (ref 78.0–100.0)
Platelets: 366 10*3/uL (ref 150–400)
RBC: 3.9 MIL/uL (ref 3.87–5.11)
RDW: 12.9 % (ref 11.5–15.5)
WBC: 7.7 10*3/uL (ref 4.0–10.5)

## 2011-06-20 NOTE — ED Provider Notes (Signed)
History     CSN: [id number]  Arrival date & time 06/20/11  1603   First MD Initiated Contact with Patient 06/20/11 1814      Chief Complaint  Patient presents with  . Dizziness  . Headache  . Nausea    (Consider location/radiation/quality/duration/timing/severity/associated sxs/prior treatment) Patient is a 26 y.o. female presenting with headaches. The history is provided by the patient. No language interpreter was used.  Headache  This is a recurrent problem. The current episode started more than 1 week ago. The problem occurs every few minutes. The problem has been gradually worsening. The headache is associated with nothing. The pain is located in the left unilateral region. The quality of the pain is described as dull. The patient is experiencing no pain. The pain does not radiate. Associated symptoms include nausea. Pertinent negatives include no anorexia, no fever, no chest pressure, no near-syncope, no syncope, no shortness of breath and no vomiting. She has tried NSAIDs for the symptoms. The treatment provided significant relief.  c/o intermittant dizziness x 1 week and this past week c/o constant dizziness x 1 week at rest and ambulating.  Negative preg test today.  Also slight h/a intermittantly but no pain now.  Went to urgent care with negative labs.  Able to ambulate with no problems.  Took meclazine with no relief.  Took ibuprofen for h/a and the pain is gone presently.  History reviewed. No pertinent past medical history.  Past Surgical History  Procedure Date  . Cesarean section   . Tibia fracture surgery     No family history on file.  History  Substance Use Topics  . Smoking status: Never Smoker   . Smokeless tobacco: Not on file  . Alcohol Use:      socially    OB History    Grav Para Term Preterm Abortions TAB SAB Ect Mult Living                  Review of Systems  Constitutional: Negative for fever.  Respiratory: Negative for shortness of breath.     Cardiovascular: Negative for syncope and near-syncope.  Gastrointestinal: Positive for nausea. Negative for vomiting and anorexia.  Neurological: Positive for headaches.  All other systems reviewed and are negative.    Allergies  Review of patient's allergies indicates no known allergies.  Home Medications   Current Outpatient Rx  Name Route Sig Dispense Refill  . NORGESTIMATE-ETH ESTRADIOL 0.25-35 MG-MCG PO TABS Oral Take 1 tablet by mouth daily.      BP 107/72  Pulse 79  Temp(Src) 98.6 F (37 C) (Oral)  Resp 20  Wt 162 lb (73.483 kg)  SpO2 96%  LMP 06/16/2011  Physical Exam  Nursing note and vitals reviewed. Constitutional: She is oriented to person, place, and time. She appears well-developed and well-nourished.  HENT:  Head: Normocephalic and atraumatic.  Eyes: Conjunctivae are normal. Pupils are equal, round, and reactive to light.  Neck: Normal range of motion.  Cardiovascular: Normal rate and regular rhythm.   Pulmonary/Chest: Effort normal and breath sounds normal.  Abdominal: Soft.  Musculoskeletal: Normal range of motion. She exhibits no edema and no tenderness.  Neurological: She is alert and oriented to person, place, and time. She has normal reflexes. She displays normal reflexes. No cranial nerve deficit. She exhibits normal muscle tone. Coordination normal.  Skin: Skin is warm and dry.  Psychiatric: She has a normal mood and affect.    ED Course  Procedures (including critical care  time)   Labs Reviewed  POCT PREGNANCY, URINE  CBC   No results found.   No diagnosis found.    MDM   Date: 06/20/2011  Rate: 72   Rhythm: normal sinus rhythm  QRS Axis: normal  Intervals: normal  ST/T Wave abnormalities: normal  Conduction Disutrbances:none  Narrative Interpretation:  Old EKG Reviewed: none available  Dizziness x 2 weeks.  Negative pregnancy test.  Ambulating without difficulty.  Hgb 11.  Orthostatic b/p negative.  Will get a pcp to follow  up with or return if worse h/a or unable to ambulate.   Labs Reviewed  CBC - Abnormal; Notable for the following:    Hemoglobin 11.5 (*)    HCT 34.1 (*)    All other components within normal limits  POCT I-STAT, CHEM 8 - Abnormal; Notable for the following:    Glucose, Bld 107 (*)    All other components within normal limits  POCT PREGNANCY, URINE         Jethro Bastos, NP 06/21/11 0005

## 2011-06-20 NOTE — ED Notes (Signed)
Pt states "I went to my on-site pregnancy test today but it came back negative, they thought it was vertigo or something like that"; pt indicates headache is left parietal, has some photophobia

## 2011-06-20 NOTE — ED Notes (Signed)
Pt. Claimed of dizziness both in standing and lying down, denied of  pain nor  nausea/vomiting.

## 2011-06-21 NOTE — ED Provider Notes (Signed)
Medical screening examination/treatment/procedure(s) were performed by non-physician practitioner and as supervising physician I was immediately available for consultation/collaboration.  Katriel Cutsforth R. Natalya Domzalski, MD 06/21/11 0045 

## 2011-08-08 ENCOUNTER — Other Ambulatory Visit: Payer: Self-pay | Admitting: Obstetrics and Gynecology

## 2011-08-08 ENCOUNTER — Other Ambulatory Visit (HOSPITAL_COMMUNITY)
Admission: RE | Admit: 2011-08-08 | Discharge: 2011-08-08 | Disposition: A | Discharge status: Home / Self Care | Payer: BC Managed Care – PPO | Source: Ambulatory Visit | Attending: Obstetrics and Gynecology | Admitting: Obstetrics and Gynecology

## 2011-08-08 DIAGNOSIS — Z113 Encounter for screening for infections with a predominantly sexual mode of transmission: Secondary | ICD-10-CM | POA: Insufficient documentation

## 2011-08-08 DIAGNOSIS — Z01419 Encounter for gynecological examination (general) (routine) without abnormal findings: Secondary | ICD-10-CM | POA: Insufficient documentation

## 2011-12-29 ENCOUNTER — Inpatient Hospital Stay (HOSPITAL_COMMUNITY)
Admission: AD | Admit: 2011-12-29 | Discharge: 2011-12-29 | Disposition: A | Discharge status: Home / Self Care | Payer: BC Managed Care – PPO | Source: Ambulatory Visit | Attending: Obstetrics and Gynecology | Admitting: Obstetrics and Gynecology

## 2011-12-29 ENCOUNTER — Encounter (HOSPITAL_COMMUNITY): Payer: Self-pay

## 2011-12-29 DIAGNOSIS — O210 Mild hyperemesis gravidarum: Secondary | ICD-10-CM | POA: Insufficient documentation

## 2011-12-29 DIAGNOSIS — R109 Unspecified abdominal pain: Secondary | ICD-10-CM | POA: Insufficient documentation

## 2011-12-29 DIAGNOSIS — O219 Vomiting of pregnancy, unspecified: Secondary | ICD-10-CM

## 2011-12-29 DIAGNOSIS — E860 Dehydration: Secondary | ICD-10-CM

## 2011-12-29 LAB — URINALYSIS, ROUTINE W REFLEX MICROSCOPIC
Bilirubin Urine: NEGATIVE
Glucose, UA: NEGATIVE mg/dL
Leukocytes, UA: NEGATIVE
Nitrite: NEGATIVE
Specific Gravity, Urine: >1.03 — ABNORMAL HIGH (ref 1.005–1.030)
Urobilinogen, UA: 1 mg/dL (ref 0.0–1.0)
pH: 6 (ref 5.0–8.0)

## 2011-12-29 LAB — BASIC METABOLIC PANEL
BUN: 8 mg/dL (ref 6–23)
CO2: 25 mEq/L (ref 19–32)
Chloride: 102 mEq/L (ref 96–112)
GFR calc Af Amer: >90 mL/min (ref >90)
GFR calc non Af Amer: >90 mL/min (ref >90)
Potassium: 3.9 mEq/L (ref 3.5–5.1)

## 2011-12-29 LAB — URINE MICROSCOPIC-ADD ON

## 2011-12-29 MED ORDER — PROMETHAZINE HCL 25 MG/ML IJ SOLN
25.0000 mg | INTRAVENOUS | Status: DC
  Filled 2011-12-29: qty 1

## 2011-12-29 MED ORDER — PROMETHAZINE HCL 12.5 MG PO TABS: 12.5000 mg | ORAL_TABLET | Freq: Four times a day (QID) | ORAL | Status: DC | PRN

## 2011-12-29 NOTE — MAU Note (Signed)
N/V in past 48 hours. Not able to keep anything down. Began throwing up blood this morning around 0830. Two instances.

## 2011-12-29 NOTE — MAU Note (Signed)
Patient has been having nausea and vomiting for a while but for the past 48 hours has been unable to keep anything down. Has seen blood in the vomit today and having some mild cramping. States medication is no longer working.

## 2011-12-29 NOTE — MAU Provider Note (Signed)
Felicia A Forbes26 y.o.G2P1001 @[redacted]w[redacted]d  by LMP Chief Complaint  Patient presents with  . Emesis During Pregnancy     First Provider Initiated Contact with Patient 12/29/11 1434      SUBJECTIVE  HPI: Nausea and vomiting worse and has retained no food, fluids or antiemetics over 48-hours. Feels weak. Had blood tinged emesis twice this morning. No lower abdominal pain or vaginal bleeding.  History reviewed. No pertinent past medical history. Past Surgical History  Procedure Date  . Cesarean section   . Tibia fracture surgery   . Fracture surgery 06/00    left knee surgery, broken tib and fib   History   Social History  . Marital Status: Single    Spouse Name: N/A    Number of Children: N/A  . Years of Education: N/A   Occupational History  . Not on file.   Social History Main Topics  . Smoking status: Never Smoker   . Smokeless tobacco: Not on file  . Alcohol Use: No     socially  . Drug Use: No  . Sexually Active: Yes   Other Topics Concern  . Not on file   Social History Narrative  . No narrative on file   No current facility-administered medications on file prior to encounter.   Current Outpatient Prescriptions on File Prior to Encounter  Medication Sig Dispense Refill  . norgestimate-ethinyl estradiol (ORTHO-CYCLEN,SPRINTEC,PREVIFEM) 0.25-35 MG-MCG tablet Take 1 tablet by mouth daily.       No Known Allergies  ROS: Pertinent items in HPI  OBJECTIVE Blood pressure 111/61, pulse 82, temperature 98.6 F (37 C), temperature source Oral, resp. rate 16, height 5\' 5"  (1.651 m), weight 76.839 kg (169 lb 6.4 oz), last menstrual period 11/12/2011, SpO2 100.00%.  GENERAL: Well-developed, well-nourished female in no acute distress.  HEENT: Normocephalic, good dentition HEART: normal rate RESP: normal effort ABDOMEN: Soft, nontender EXTREMITIES: Nontender, no edema NEURO: Alert and oriented LAB RESULTS  Results for orders placed during the hospital encounter of  12/29/11 (from the past 24 hour(s))  URINALYSIS, ROUTINE W REFLEX MICROSCOPIC     Status: Abnormal   Collection Time   12/29/11  1:55 PM      Component Value Range   Color, Urine YELLOW  YELLOW   APPearance HAZY (*) CLEAR   Specific Gravity, Urine >1.030 (*) 1.005 - 1.030   pH 6.0  5.0 - 8.0   Glucose, UA NEGATIVE  NEGATIVE mg/dL   Hgb urine dipstick MODERATE (*) NEGATIVE   Bilirubin Urine NEGATIVE  NEGATIVE   Ketones, ur 40 (*) NEGATIVE mg/dL   Protein, ur NEGATIVE  NEGATIVE mg/dL   Urobilinogen, UA 1.0  0.0 - 1.0 mg/dL   Nitrite NEGATIVE  NEGATIVE   Leukocytes, UA NEGATIVE  NEGATIVE  URINE MICROSCOPIC-ADD ON     Status: Abnormal   Collection Time   12/29/11  1:55 PM      Component Value Range   Squamous Epithelial / LPF MANY (*) RARE   WBC, UA 0-2  <3 WBC/hpf   RBC / HPF 3-6  <3 RBC/hpf   Bacteria, UA FEW (*) RARE  BASIC METABOLIC PANEL     Status: Normal   Collection Time   12/29/11  2:50 PM      Component Value Range   Sodium 137  135 - 145 mEq/L   Potassium 3.9  3.5 - 5.1 mEq/L   Chloride 102  96 - 112 mEq/L   CO2 25  19 - 32 mEq/L  Glucose, Bld 80  70 - 99 mg/dL   BUN 8  6 - 23 mg/dL   Creatinine, Ser 1.61  0.50 - 1.10 mg/dL   Calcium 8.9  8.4 - 09.6 mg/dL   GFR calc non Af Amer >90  >90 mL/min   GFR calc Af Amer >90  >90 mL/min    MAU COURSE: IVLR with Phenergan 25 mg -> tolerating po and feeling much improved   ASSESSMENT G2P1 at [redacted]w[redacted]d 1. Nausea/vomiting in pregnancy   2. Dehydration     PLAN  Medication List  As of 12/29/2011  2:35 PM   ASK your doctor about these medications         norgestimate-ethinyl estradiol 0.25-35 MG-MCG tablet   Commonly known as: ORTHO-CYCLEN,SPRINTEC,PREVIFEM   Take 1 tablet by mouth daily.      ondansetron 4 MG disintegrating tablet   Commonly known as: ZOFRAN-ODT   Take 4 mg by mouth every 8 (eight) hours as needed.      prenatal multivitamin Tabs   Take 1 tablet by mouth at bedtime.          Discussed relief  measures for N/V  F/U as scheduled with Dr. Dion Body     POE,DEIRDRE 12/29/2011 2:35 PM

## 2012-03-11 ENCOUNTER — Inpatient Hospital Stay (HOSPITAL_COMMUNITY)
Admission: AD | Admit: 2012-03-11 | Discharge: 2012-03-12 | Disposition: A | Discharge status: Home / Self Care | Payer: BC Managed Care – PPO | Source: Ambulatory Visit | Attending: Obstetrics and Gynecology | Admitting: Obstetrics and Gynecology

## 2012-03-11 ENCOUNTER — Encounter (HOSPITAL_COMMUNITY): Payer: Self-pay | Admitting: *Deleted

## 2012-03-11 DIAGNOSIS — O4420 Partial placenta previa NOS or without hemorrhage, unspecified trimester: Secondary | ICD-10-CM

## 2012-03-11 DIAGNOSIS — M549 Dorsalgia, unspecified: Secondary | ICD-10-CM | POA: Insufficient documentation

## 2012-03-11 DIAGNOSIS — R42 Dizziness and giddiness: Secondary | ICD-10-CM | POA: Insufficient documentation

## 2012-03-11 DIAGNOSIS — N949 Unspecified condition associated with female genital organs and menstrual cycle: Secondary | ICD-10-CM | POA: Insufficient documentation

## 2012-03-11 DIAGNOSIS — O99891 Other specified diseases and conditions complicating pregnancy: Secondary | ICD-10-CM | POA: Insufficient documentation

## 2012-03-11 DIAGNOSIS — R109 Unspecified abdominal pain: Secondary | ICD-10-CM | POA: Insufficient documentation

## 2012-03-11 LAB — URINALYSIS, ROUTINE W REFLEX MICROSCOPIC
Bilirubin Urine: NEGATIVE
Ketones, ur: NEGATIVE mg/dL
Leukocytes, UA: NEGATIVE
Nitrite: NEGATIVE
Protein, ur: NEGATIVE mg/dL
pH: 6.5 (ref 5.0–8.0)

## 2012-03-11 LAB — URINE MICROSCOPIC-ADD ON

## 2012-03-11 NOTE — MAU Note (Signed)
Pt 16.6wks having lower back pain, pelvic pain and feeling faint since 2100.

## 2012-03-12 ENCOUNTER — Inpatient Hospital Stay (HOSPITAL_COMMUNITY): Payer: BC Managed Care – PPO

## 2012-03-12 DIAGNOSIS — N949 Unspecified condition associated with female genital organs and menstrual cycle: Secondary | ICD-10-CM

## 2012-03-12 DIAGNOSIS — O4420 Partial placenta previa NOS or without hemorrhage, unspecified trimester: Secondary | ICD-10-CM | POA: Diagnosis present

## 2012-03-12 LAB — COMPREHENSIVE METABOLIC PANEL
ALT: 10 U/L (ref 0–35)
Alkaline Phosphatase: 73 U/L (ref 39–117)
BUN: 5 mg/dL — ABNORMAL LOW (ref 6–23)
CO2: 23 mEq/L (ref 19–32)
GFR calc Af Amer: >90 mL/min (ref >90)
GFR calc non Af Amer: >90 mL/min (ref >90)
Glucose, Bld: 86 mg/dL (ref 70–99)
Potassium: 3.6 mEq/L (ref 3.5–5.1)
Sodium: 133 mEq/L — ABNORMAL LOW (ref 135–145)
Total Bilirubin: 0.2 mg/dL — ABNORMAL LOW (ref 0.3–1.2)

## 2012-03-12 LAB — CBC
HCT: 28 % — ABNORMAL LOW (ref 36.0–46.0)
Hemoglobin: 9.6 g/dL — ABNORMAL LOW (ref 12.0–15.0)
MCHC: 34.3 g/dL (ref 30.0–36.0)
RBC: 3.24 MIL/uL — ABNORMAL LOW (ref 3.87–5.11)

## 2012-03-12 IMAGING — US US OB TRANSVAGINAL
1 series · 12 of 12 positions shown · non-contrast
Comparison: None

CLINICAL DATA: Abdominal pain and evaluate cervical length.

TRANSVAGINAL OB ULTRASOUND
TECHNIQUE: Transvaginal ultrasound was performed for evaluation of
the gestation as well as the maternal uterus and adnexal regions.

[Series 1: us ob transvaginal · 12 of 12 slices shown]
[im 1/12]
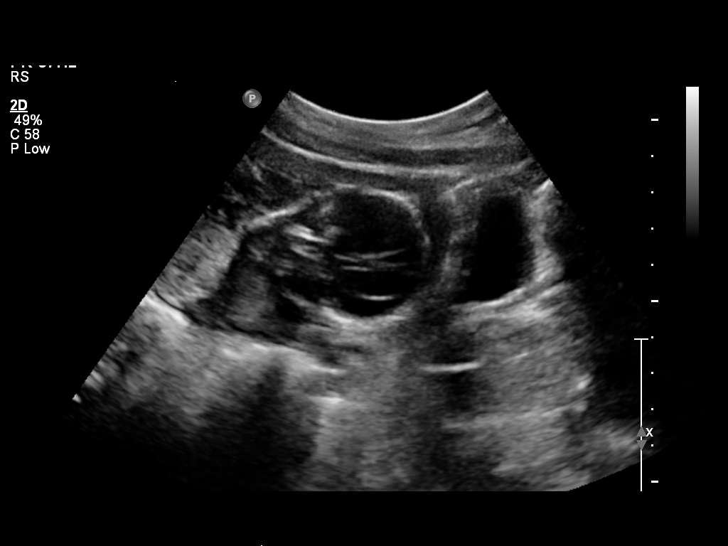
[im 2/12]
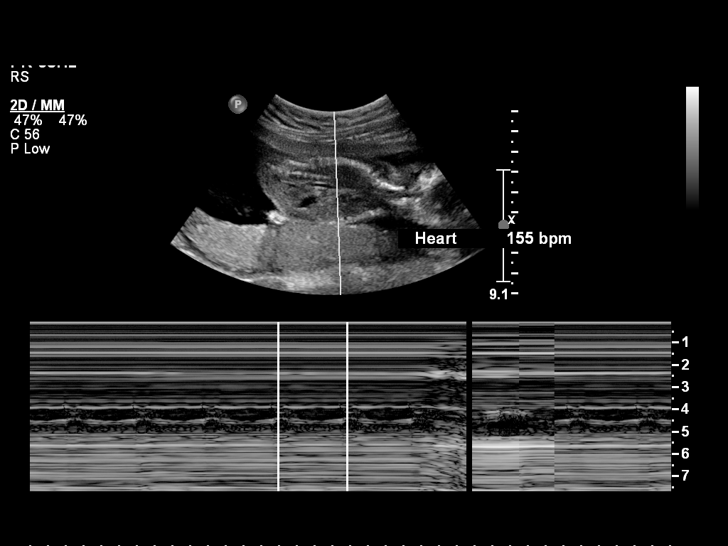
[im 3/12]
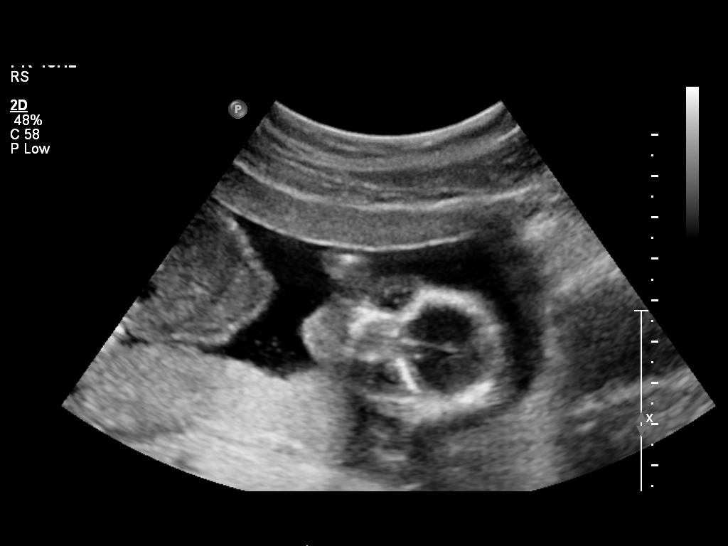
[im 4/12]
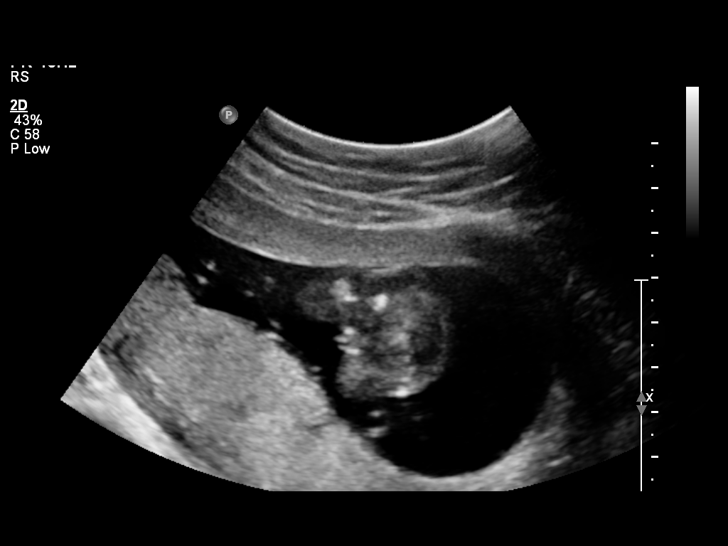
[im 5/12]
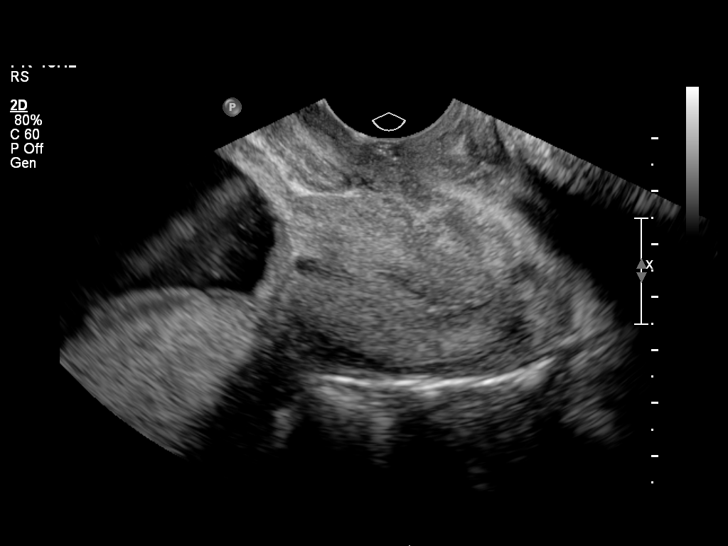
[im 6/12]
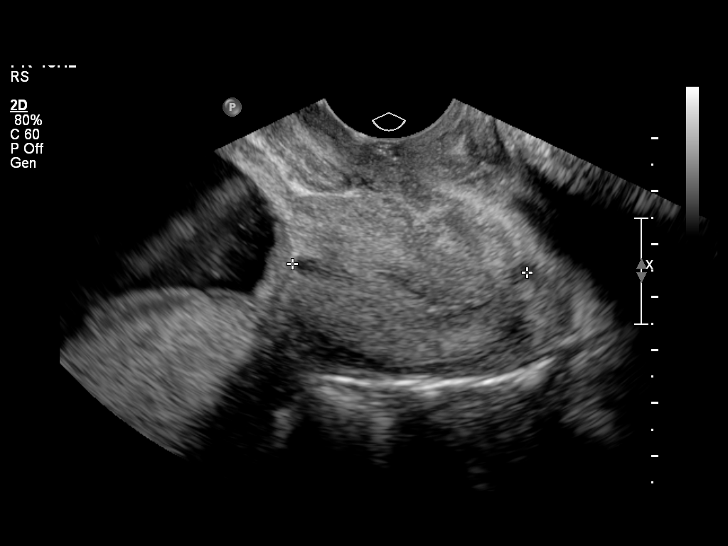
[im 7/12]
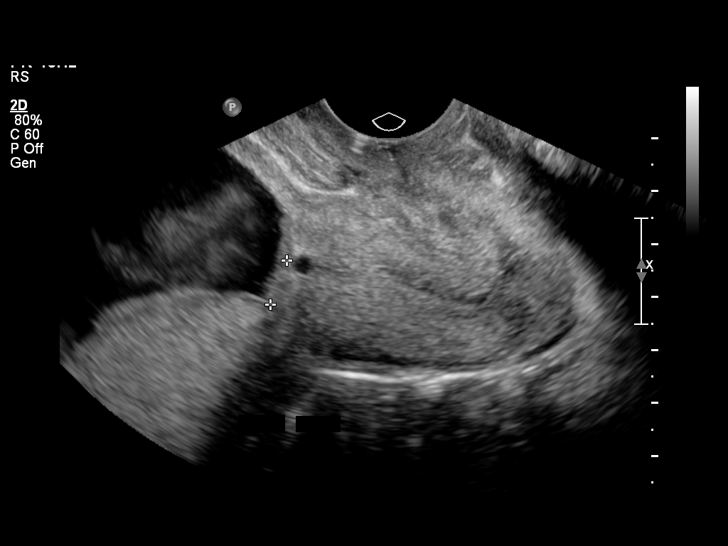
[im 8/12]
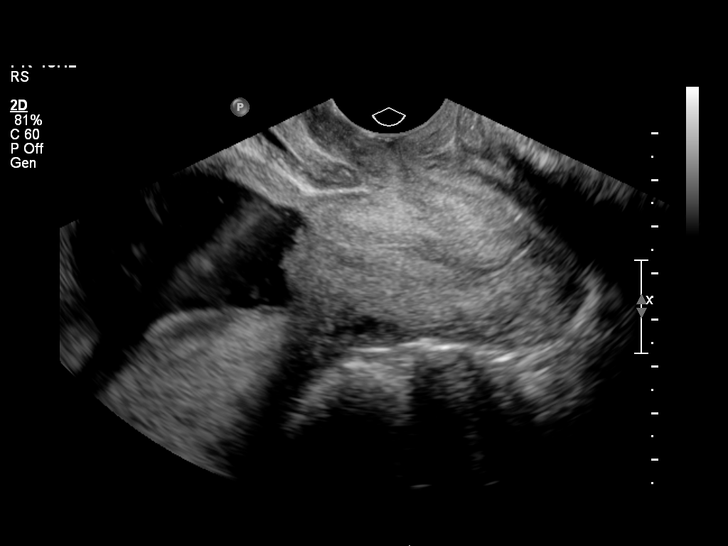
[im 9/12]
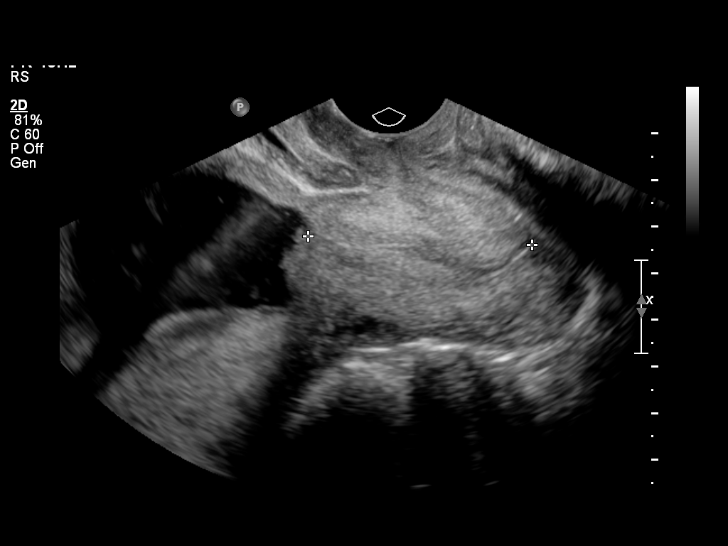
[im 10/12]
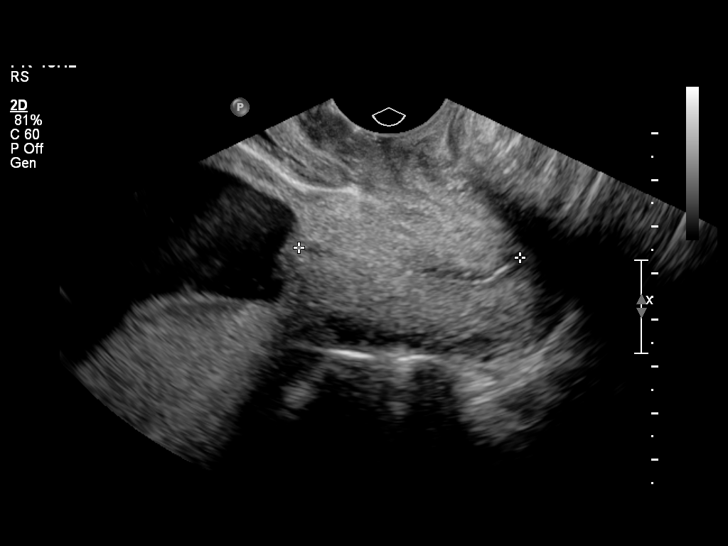
[im 11/12]
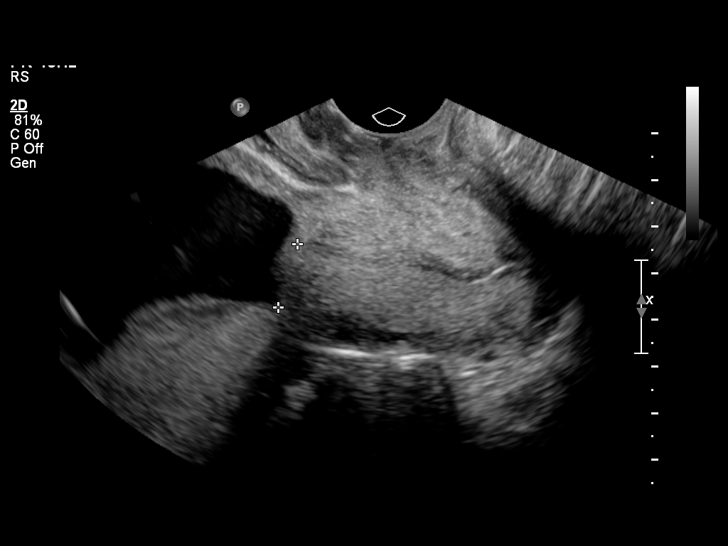
[im 12/12]
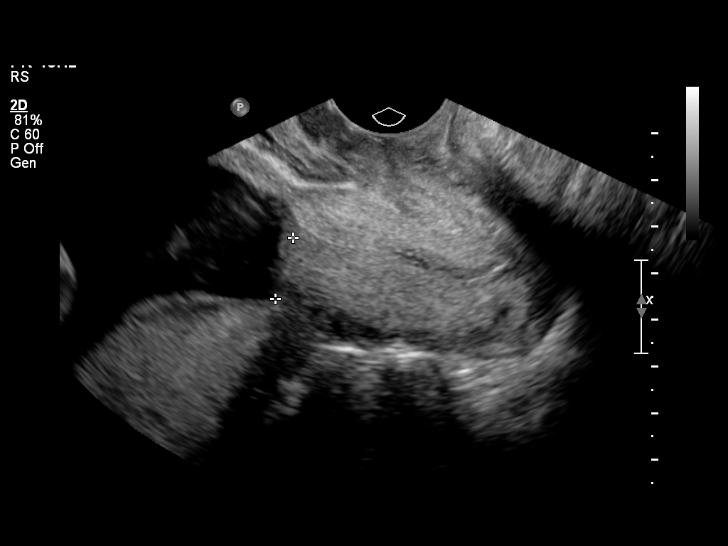

[12 of 12 positions shown; findings below may reference images not displayed]

FINDINGS: The cervix is closed and measures 4.4 cm in length.  The
placenta is approximately 1.4 cm from the internal cervical os.
The presentation is cephalic.  The heart rate is 155 beats per
minute.
IMPRESSION: The cervix is closed and measures 4.4 cm in length.

There is a marginal placenta previa measuring 1.4 cm from the
internal cervical os.

## 2012-03-12 MED ORDER — ONDANSETRON 4 MG PO TBDP: 4.0000 mg | ORAL_TABLET | Freq: Three times a day (TID) | ORAL | Status: DC | PRN

## 2012-03-12 NOTE — MAU Provider Note (Signed)
Chief Complaint: Back Pain, Pelvic Pain and Dizziness   First Provider Initiated Contact with Patient 03/12/12 0031     SUBJECTIVE HPI: Felicia Vasquez is a 26 y.o. G2P1001 at [redacted]w[redacted]d by LMP who presents to maternity admissions reporting abdominal pain x24 hours described as sharp and intermittent.  She is also feeling faint intermittently.  She reports vomiting a few times today and not keeping down all the water she tried to drink.  She reports fetal movement, denies LOF, vaginal bleeding, vaginal itching/burning, urinary symptoms, h/a, dizziness, n/v, or fever/chills.     History reviewed. No pertinent past medical history. Past Surgical History  Procedure Date  . Cesarean section   . Tibia fracture surgery   . Fracture surgery 06/00    left knee surgery, broken tib and fib   History   Social History  . Marital Status: Married    Spouse Name: N/A    Number of Children: N/A  . Years of Education: N/A   Occupational History  . Not on file.   Social History Main Topics  . Smoking status: Never Smoker   . Smokeless tobacco: Not on file  . Alcohol Use: No     socially  . Drug Use: No  . Sexually Active: Yes   Other Topics Concern  . Not on file   Social History Narrative  . No narrative on file   No current facility-administered medications on file prior to encounter.   Current Outpatient Prescriptions on File Prior to Encounter  Medication Sig Dispense Refill  . ondansetron (ZOFRAN-ODT) 4 MG disintegrating tablet Take 4 mg by mouth every 8 (eight) hours as needed.      . Prenatal Vit-Fe Fumarate-FA (PRENATAL MULTIVITAMIN) TABS Take 1 tablet by mouth at bedtime.      . promethazine (PHENERGAN) 12.5 MG tablet Take 1 tablet (12.5 mg total) by mouth every 6 (six) hours as needed for nausea.  30 tablet  0   No Known Allergies  ROS: Pertinent items in HPI  OBJECTIVE Blood pressure 102/58, pulse 92, temperature 98.4 F (36.9 C), resp. rate 16, height 5\' 5"  (1.651 m),  weight 74.027 kg (163 lb 3.2 oz), last menstrual period 11/12/2011. GENERAL: Well-developed, well-nourished female in no acute distress.  HEENT: Normocephalic HEART: normal rate, regular rhythm RESP: normal effort, lung sounds clear and equal bilaterally ABDOMEN: Soft, non-tender EXTREMITIES: Nontender, no edema NEURO: Alert and oriented Cervix closed, firm, posterior, subjectively shortened.  LAB RESULTS Results for orders placed during the hospital encounter of 03/11/12 (from the past 24 hour(s))  URINALYSIS, ROUTINE W REFLEX MICROSCOPIC     Status: Abnormal   Collection Time   03/11/12 11:24 PM      Component Value Range   Color, Urine YELLOW  YELLOW   APPearance CLEAR  CLEAR   Specific Gravity, Urine 1.010  1.005 - 1.030   pH 6.5  5.0 - 8.0   Glucose, UA NEGATIVE  NEGATIVE mg/dL   Hgb urine dipstick SMALL (*) NEGATIVE   Bilirubin Urine NEGATIVE  NEGATIVE   Ketones, ur NEGATIVE  NEGATIVE mg/dL   Protein, ur NEGATIVE  NEGATIVE mg/dL   Urobilinogen, UA 0.2  0.0 - 1.0 mg/dL   Nitrite NEGATIVE  NEGATIVE   Leukocytes, UA NEGATIVE  NEGATIVE  URINE MICROSCOPIC-ADD ON     Status: Abnormal   Collection Time   03/11/12 11:24 PM      Component Value Range   Squamous Epithelial / LPF FEW (*) RARE   WBC, UA  3-6  <3 WBC/hpf   RBC / HPF 0-2  <3 RBC/hpf   Bacteria, UA FEW (*) RARE  CBC     Status: Abnormal   Collection Time   03/12/12 12:35 AM      Component Value Range   WBC 10.6 (*) 4.0 - 10.5 K/uL   RBC 3.24 (*) 3.87 - 5.11 MIL/uL   Hemoglobin 9.6 (*) 12.0 - 15.0 g/dL   HCT 16.1 (*) 09.6 - 04.5 %   MCV 86.4  78.0 - 100.0 fL   MCH 29.6  26.0 - 34.0 pg   MCHC 34.3  30.0 - 36.0 g/dL   RDW 40.9  81.1 - 91.4 %   Platelets 261  150 - 400 K/uL  COMPREHENSIVE METABOLIC PANEL     Status: Abnormal   Collection Time   03/12/12 12:35 AM      Component Value Range   Sodium 133 (*) 135 - 145 mEq/L   Potassium 3.6  3.5 - 5.1 mEq/L   Chloride 99  96 - 112 mEq/L   CO2 23  19 - 32 mEq/L     Glucose, Bld 86  70 - 99 mg/dL   BUN 5 (*) 6 - 23 mg/dL   Creatinine, Ser 7.82  0.50 - 1.10 mg/dL   Calcium 8.7  8.4 - 95.6 mg/dL   Total Protein 6.4  6.0 - 8.3 g/dL   Albumin 2.3 (*) 3.5 - 5.2 g/dL   AST 15  0 - 37 U/L   ALT 10  0 - 35 U/L   Alkaline Phosphatase 73  39 - 117 U/L   Total Bilirubin 0.2 (*) 0.3 - 1.2 mg/dL   GFR calc non Af Amer >90  >90 mL/min   GFR calc Af Amer >90  >90 mL/min    IMAGING US Ob Transvaginal  03/12/2012  *RADIOLOGY REPORT*  Clinical Data: Abdominal pain and evaluate cervical length.  TRANSVAGINAL OB ULTRASOUND  Technique:  Transvaginal ultrasound was performed for evaluation of the gestation as well as the maternal uterus and adnexal regions.  Comparison: None  Findings: The cervix is closed and measures 4.4 cm in length.  The placenta is approximately 1.4 cm from the internal cervical os. The presentation is cephalic.  The heart rate is 155 beats per minute.  IMPRESSION: The cervix is closed and measures 4.4 cm in length.  There is a marginal placenta previa measuring 1.4 cm from the internal cervical os.   Original Report Authenticated By: Richarda Overlie, M.D.     ASSESSMENT 1. Round ligament pain   2. Marginal placenta    PLAN Discharge home Keep scheduled prenatal appointments Zofran odt prescription renewed Drink plenty of fluids Return to MAU as needed    Medication List     As of 03/12/2012 12:32 AM    ASK your doctor about these medications         DICLEGIS 10-10 MG Tbec   Generic drug: Doxylamine-Pyridoxine   Take 10 mg by mouth.      ondansetron 4 MG disintegrating tablet   Commonly known as: ZOFRAN-ODT   Take 4 mg by mouth every 8 (eight) hours as needed.      prenatal multivitamin Tabs   Take 1 tablet by mouth at bedtime.      promethazine 12.5 MG tablet   Commonly known as: PHENERGAN   Take 1 tablet (12.5 mg total) by mouth every 6 (six) hours as needed for nausea.      promethazine 12.5 MG  tablet   Commonly known as:  PHENERGAN   Take 12.5 mg by mouth every 6 (six) hours as needed.         Sharen Counter Certified Nurse-Midwife 03/12/2012  12:32 AM

## 2012-03-22 NOTE — MAU Provider Note (Signed)
Reviewed

## 2012-05-29 ENCOUNTER — Encounter (HOSPITAL_COMMUNITY): Payer: Self-pay

## 2012-05-29 ENCOUNTER — Inpatient Hospital Stay (HOSPITAL_COMMUNITY)
Admission: AD | Admit: 2012-05-29 | Discharge: 2012-05-29 | Disposition: A | Discharge status: Home / Self Care | Payer: BC Managed Care – PPO | Source: Ambulatory Visit | Attending: Obstetrics and Gynecology | Admitting: Obstetrics and Gynecology

## 2012-05-29 DIAGNOSIS — O479 False labor, unspecified: Secondary | ICD-10-CM

## 2012-05-29 DIAGNOSIS — O4700 False labor before 37 completed weeks of gestation, unspecified trimester: Secondary | ICD-10-CM | POA: Insufficient documentation

## 2012-05-29 LAB — URINALYSIS, ROUTINE W REFLEX MICROSCOPIC
Bilirubin Urine: NEGATIVE
Ketones, ur: NEGATIVE mg/dL
Leukocytes, UA: NEGATIVE
Nitrite: NEGATIVE
Protein, ur: NEGATIVE mg/dL
Urobilinogen, UA: 0.2 mg/dL (ref 0.0–1.0)

## 2012-05-29 LAB — URINE MICROSCOPIC-ADD ON

## 2012-05-29 LAB — WET PREP, GENITAL
Clue Cells Wet Prep HPF POC: NONE SEEN
Trich, Wet Prep: NONE SEEN

## 2012-05-29 NOTE — MAU Provider Note (Signed)
Chief Complaint:  Laboring   First Provider Initiated Contact with Patient 05/29/12 1228      HPI: Felicia Vasquez is a 27 y.o. G2P1001 at [redacted]w[redacted]d who presents to maternity admissions reporting contactions. The patient states that she has had occasional contractions recently but today, since about 6 am, she was having 3-4/hour with pressure in her lower abdomen. She states that over the last hour prior to coming to MAU she was only having 2-3/hour and they are less severe now. She had one small episode of spotting this morning when she wiped, but has not noticed any vaginal bleeding since then. The patient states that she had a placenta previa on her first [place] and was told on her more recent US that it was moving away from the os. She has some mild cramping which she describes as uncomfortable and not painful. She denies abnormal discharge or LOF at this time. She denies urinary symptoms. She reports good fetal movement.   Past Medical History: Past Medical History  Diagnosis Date  . Preterm labor     Past obstetric history: OB History    Grav Para Term Preterm Abortions TAB SAB Ect Mult Living   2 1 1       1      # Outc Date GA Lbr Len/2nd Wgt Sex Del Anes PTL Lv   1 TRM            2 CUR               Past Surgical History: Past Surgical History  Procedure Date  . Tibia fracture surgery   . Fracture surgery 06/00    left knee surgery, broken tib and fib  . Cesarean section     Fetal distress    Family History: Family History  Problem Relation Age of Onset  . Diabetes Mother   . Hypertension Maternal Uncle     Social History: History  Substance Use Topics  . Smoking status: Never Smoker   . Smokeless tobacco: Never Used  . Alcohol Use: No     Comment: socially    Allergies: No Known Allergies  Meds:  Prescriptions prior to admission  Medication Sig Dispense Refill  . promethazine (PHENERGAN) 12.5 MG tablet Take 1 tablet (12.5 mg total) by mouth every 6 (six)  hours as needed for nausea.  30 tablet  0    ROS: Pertinent findings in history of present illness.  Physical Exam  Blood pressure 110/62, pulse 103, temperature 98.2 F (36.8 C), temperature source Oral, resp. rate 18, height 5\' 3"  (1.6 m), weight 174 lb (78.926 kg), last menstrual period 11/12/2011. GENERAL: Well-developed, well-nourished female in no acute distress.  HEENT: normocephalic, atraumatic HEART: normal rate and rhythm RESP: normal effort, clear to auscultation ABDOMEN: Soft, non-tender, gravid appropriate for gestational age EXTREMITIES: Nontender, no edema NEURO: alert and oriented SPECULUM EXAM: Physiologic discharge, no blood, cervix clean Dilation: Closed (external ft, internal closed) Effacement (%): 50 Cervical Position: Posterior Exam by:: JEthier, CNM/SBeck, RN  FHT:  Baseline 160 , moderate variability, accelerations present, no decelerations Contractions: 1-2 contractions while on monitor x 1 hour 22 minutes   Labs: Results for orders placed during the hospital encounter of 05/29/12 (from the past 24 hour(s))  URINALYSIS, ROUTINE W REFLEX MICROSCOPIC     Status: Abnormal   Collection Time   05/29/12 11:55 AM      Component Value Range   Color, Urine YELLOW  YELLOW   APPearance HAZY (*)  CLEAR   Specific Gravity, Urine 1.010  1.005 - 1.030   pH 7.0  5.0 - 8.0   Glucose, UA NEGATIVE  NEGATIVE mg/dL   Hgb urine dipstick TRACE (*) NEGATIVE   Bilirubin Urine NEGATIVE  NEGATIVE   Ketones, ur NEGATIVE  NEGATIVE mg/dL   Protein, ur NEGATIVE  NEGATIVE mg/dL   Urobilinogen, UA 0.2  0.0 - 1.0 mg/dL   Nitrite NEGATIVE  NEGATIVE   Leukocytes, UA NEGATIVE  NEGATIVE  URINE MICROSCOPIC-ADD ON     Status: Abnormal   Collection Time   05/29/12 11:55 AM      Component Value Range   Squamous Epithelial / LPF MANY (*) RARE   WBC, UA 0-2  <3 WBC/hpf   RBC / HPF 0-2  <3 RBC/hpf   Bacteria, UA FEW (*) RARE  WET PREP, GENITAL     Status: Abnormal   Collection Time    05/29/12  1:18 PM      Component Value Range   Yeast Wet Prep HPF POC NONE SEEN  NONE SEEN   Trich, Wet Prep NONE SEEN  NONE SEEN   Clue Cells Wet Prep HPF POC NONE SEEN  NONE SEEN   WBC, Wet Prep HPF POC FEW (*) NONE SEEN    Imaging:  None  MAU Course: Patient monitored x 1 hour 22 minutes Discussed patient with Dr. Richardson Dopp. Inform patient not to have intercourse, increase fluid intake and follow-up in the office as scheduled  Assessment: 1. Preterm contractions     Plan: Discharge home Labor precautions discussed Follow-up with [facility] as scheduled Return to MAU as needed.       Follow-up Information    Follow up with Geryl Rankins, MD. (As scheduled)    Contact information:   [address], [address] [place] [address] [phone]       Follow up with [facility] [place] [facility]ERN[facility]. (As needed if symptoms worsen)    Contact information:   [address] [place] [address] [phone]          Medication List     As of 05/29/2012  1:58 PM    TAKE these medications         promethazine 12.5 MG tablet   Commonly known as: PHENERGAN   Take 1 tablet (12.5 mg total) by mouth every 6 (six) hours as needed for nausea.          Freddi Starr, PA-C 05/29/2012 1:58 PM

## 2012-05-29 NOTE — MAU Note (Signed)
Has been having braxton hicks,  This morning have been more regular, 4/hr.  Feeling some pressure.  Some spotting this moring.  Denies problems with placenta.

## 2012-05-29 NOTE — MAU Note (Signed)
Pt states having ctx's q61minutes apart, noted spotting earlier this am,  Denies uti s/s.

## 2012-05-30 LAB — GC/CHLAMYDIA PROBE AMP
CT Probe RNA: NEGATIVE
GC Probe RNA: NEGATIVE

## 2012-06-16 ENCOUNTER — Encounter (HOSPITAL_COMMUNITY): Payer: Self-pay | Admitting: *Deleted

## 2012-06-16 ENCOUNTER — Inpatient Hospital Stay (HOSPITAL_COMMUNITY)
Admission: AD | Admit: 2012-06-16 | Discharge: 2012-06-16 | Disposition: A | Discharge status: Home / Self Care | Payer: BC Managed Care – PPO | Source: Ambulatory Visit | Attending: Obstetrics and Gynecology | Admitting: Obstetrics and Gynecology

## 2012-06-16 DIAGNOSIS — R1110 Vomiting, unspecified: Secondary | ICD-10-CM

## 2012-06-16 DIAGNOSIS — A088 Other specified intestinal infections: Secondary | ICD-10-CM | POA: Insufficient documentation

## 2012-06-16 DIAGNOSIS — A084 Viral intestinal infection, unspecified: Secondary | ICD-10-CM

## 2012-06-16 DIAGNOSIS — R109 Unspecified abdominal pain: Secondary | ICD-10-CM | POA: Insufficient documentation

## 2012-06-16 DIAGNOSIS — O99891 Other specified diseases and conditions complicating pregnancy: Secondary | ICD-10-CM | POA: Insufficient documentation

## 2012-06-16 LAB — URINALYSIS, ROUTINE W REFLEX MICROSCOPIC
Bilirubin Urine: NEGATIVE
Ketones, ur: >80 mg/dL — AB
Nitrite: NEGATIVE
Protein, ur: 30 mg/dL — AB
pH: 6.5 (ref 5.0–8.0)

## 2012-06-16 LAB — URINE MICROSCOPIC-ADD ON

## 2012-06-16 MED ORDER — DEXTROSE IN LACTATED RINGERS 5 % IV SOLN
Freq: Once | INTRAVENOUS | Status: AC
  Administered 2012-06-16: 22:00:00 via INTRAVENOUS

## 2012-06-16 MED ORDER — ACETAMINOPHEN 500 MG PO TABS
1000.0000 mg | ORAL_TABLET | ORAL | Status: AC
  Administered 2012-06-16: 1000 mg via ORAL
  Filled 2012-06-16: qty 2

## 2012-06-16 NOTE — MAU Provider Note (Signed)
History     CSN: [id number]  Arrival date and time: 06/16/12 2007   First Provider Initiated Contact with Patient 06/16/12 2048      Chief Complaint  Patient presents with  . Fatigue  . Emesis During Pregnancy  . Fever  . Back Pain   HPI  Pt states she started vomiting today at noon today x 1.  Also reports three loose stools today.  +lower abdominal pain that started yesterday and continued today.  Temperature at home 100.4.  No recent exposure to ill individuals.  No report of vaginal bleeding.  + intermittent contractions.    Past Medical History  Diagnosis Date  . Preterm labor     Past Surgical History  Procedure Date  . Tibia fracture surgery   . Fracture surgery 06/00    left knee surgery, broken tib and fib  . Cesarean section     Fetal distress    Family History  Problem Relation Age of Onset  . Diabetes Mother   . Hypertension Maternal Uncle     History  Substance Use Topics  . Smoking status: Never Smoker   . Smokeless tobacco: Never Used  . Alcohol Use: No     Comment: socially    Allergies: No Known Allergies  Prescriptions prior to admission  Medication Sig Dispense Refill  . acetaminophen (TYLENOL) 325 MG tablet Take 650 mg by mouth every 6 (six) hours as needed. fever      . Fe Fum-FePoly-Vit C-Vit B3 (INTEGRA PO) Take 1 capsule by mouth at bedtime.        Review of Systems  Constitutional: Positive for fever, chills and malaise/fatigue.  Gastrointestinal: Positive for nausea, vomiting, abdominal pain and diarrhea.  Genitourinary: Negative.   Neurological: Positive for weakness.  All other systems reviewed and are negative.   Physical Exam   Blood pressure 112/61, pulse 108, temperature 99.7 F (37.6 C), temperature source Oral, resp. rate 20, height 5\' 5"  (1.651 m), weight 78.835 kg (173 lb 12.8 oz), last menstrual period 11/12/2011.  Physical Exam  Constitutional: She is oriented to person, place, and time. She appears  well-developed and well-nourished. No distress.  HENT:  Head: Normocephalic.  Mouth/Throat: Mucous membranes are dry.  Neck: Normal range of motion. Neck supple.  Cardiovascular: Normal rate, regular rhythm and normal heart sounds.   Respiratory: Effort normal and breath sounds normal.  GI: Soft. There is tenderness.  Genitourinary: No bleeding around the vagina. No vaginal discharge (mucusy) found.  Neurological: She is alert and oriented to person, place, and time. She has normal reflexes.  Skin: Skin is warm and dry. She is not diaphoretic.   Dilation: Closed Effacement (%): Thick Cervical Position: Posterior Exam by:: Roney Marion, CNM   MAU Course  Procedures Results for orders placed during the hospital encounter of 06/16/12 (from the past 24 hour(s))  URINALYSIS, ROUTINE W REFLEX MICROSCOPIC     Status: Abnormal   Collection Time   06/16/12  8:25 PM      Component Value Range   Color, Urine YELLOW  YELLOW   APPearance HAZY (*) CLEAR   Specific Gravity, Urine 1.020  1.005 - 1.030   pH 6.5  5.0 - 8.0   Glucose, UA NEGATIVE  NEGATIVE mg/dL   Hgb urine dipstick TRACE (*) NEGATIVE   Bilirubin Urine NEGATIVE  NEGATIVE   Ketones, ur >80 (*) NEGATIVE mg/dL   Protein, ur 30 (*) NEGATIVE mg/dL   Urobilinogen, UA 0.2  0.0 - 1.0 mg/dL  Nitrite NEGATIVE  NEGATIVE   Leukocytes, UA NEGATIVE  NEGATIVE  URINE MICROSCOPIC-ADD ON     Status: Abnormal   Collection Time   06/16/12  8:25 PM      Component Value Range   Squamous Epithelial / LPF FEW (*) RARE   WBC, UA 3-6  <3 WBC/hpf   RBC / HPF 0-2  <3 RBC/hpf   Bacteria, UA MANY (*) RARE   Urine-Other MUCOUS PRESENT       Consulted with Dr. Dion Body > IV fluids   FHR 150's, +accels, reactive Toco - none Assessment and Plan  Viral Gastroenteritis Category I Fetal Tracing  Plan: DC to home Increase fluids Rest Tylenol for fever Follow-up with Dr. Dion Body  [facility] 06/16/2012, 8:50 PM

## 2012-06-16 NOTE — MAU Note (Signed)
Since this am have fever, weakness, vomited once and diarrhea x 3, back pain.

## 2012-06-16 NOTE — MAU Note (Signed)
PT SAYS SHE  STARTED VOMITING AT 12NOON-  X1 . TODAY.    HAS HAD DIARRHEA X3 TODAY.   LOWER ABD PAIN  STARTED Friday NIGHT -  AND  SOME THROUGH OUT  DAY.    FEVER AT HOME 99.0 THEN WENT TO 100.4-  TOOK  TYLENOL AT  3 PM.  SHE CALLED DR VARNADO AT 7 PM-  TOLD  TO COME IN.  DENIES HSV AND MRSA.

## 2012-06-18 LAB — URINE CULTURE

## 2012-07-31 ENCOUNTER — Inpatient Hospital Stay (HOSPITAL_COMMUNITY): Payer: BC Managed Care – PPO

## 2012-07-31 ENCOUNTER — Inpatient Hospital Stay (HOSPITAL_COMMUNITY)
Admission: AD | Admit: 2012-07-31 | Discharge: 2012-07-31 | Disposition: A | Discharge status: Home / Self Care | Payer: BC Managed Care – PPO | Source: Ambulatory Visit | Attending: Obstetrics and Gynecology | Admitting: Obstetrics and Gynecology

## 2012-07-31 ENCOUNTER — Encounter (HOSPITAL_COMMUNITY): Payer: Self-pay | Admitting: *Deleted

## 2012-07-31 DIAGNOSIS — Y9239 Other specified sports and athletic area as the place of occurrence of the external cause: Secondary | ICD-10-CM | POA: Insufficient documentation

## 2012-07-31 DIAGNOSIS — W1809XA Striking against other object with subsequent fall, initial encounter: Secondary | ICD-10-CM | POA: Insufficient documentation

## 2012-07-31 DIAGNOSIS — R109 Unspecified abdominal pain: Secondary | ICD-10-CM

## 2012-07-31 DIAGNOSIS — O99891 Other specified diseases and conditions complicating pregnancy: Secondary | ICD-10-CM

## 2012-07-31 DIAGNOSIS — R42 Dizziness and giddiness: Secondary | ICD-10-CM

## 2012-07-31 LAB — COMPREHENSIVE METABOLIC PANEL
BUN: 7 mg/dL (ref 6–23)
Calcium: 8.6 mg/dL (ref 8.4–10.5)
GFR calc Af Amer: >90 mL/min (ref >90)
Glucose, Bld: 74 mg/dL (ref 70–99)
Total Protein: 6.4 g/dL (ref 6.0–8.3)

## 2012-07-31 LAB — CBC
HCT: 29.9 % — ABNORMAL LOW (ref 36.0–46.0)
Hemoglobin: 9.7 g/dL — ABNORMAL LOW (ref 12.0–15.0)
MCH: 28 pg (ref 26.0–34.0)
MCHC: 32.4 g/dL (ref 30.0–36.0)
RDW: 15.9 % — ABNORMAL HIGH (ref 11.5–15.5)

## 2012-07-31 NOTE — Progress Notes (Signed)
Lisa Leftwich Kirby CNM in to see pt 

## 2012-07-31 NOTE — MAU Provider Note (Signed)
Chief Complaint:  Dizziness and Fall   First Provider Initiated Contact with Patient 07/31/12 1236      HPI: Felicia Vasquez is a 27 y.o. G2P1001 at 81w1dwho presents to maternity admissions reporting dizziness and a fall at work today.  She reports feeling dizzy and then falling but not completely losing consciousness. She caught herself on the floor with her hand but did hit the right side of her abdomen on the floor.  Since the fall, she has some mild sharp tingling sensations on the right side of her abdomen that are constant. She reports good fetal movement, denies cramping/contractions, LOF, vaginal bleeding, vaginal itching/burning, urinary symptoms, h/a, dizziness, n/v, or fever/chills.    Past Medical History: History reviewed. No pertinent past medical history.  Past obstetric history: OB History   Grav Para Term Preterm Abortions TAB SAB Ect Mult Living   2 1 1       1      # Outc Date GA Lbr Len/2nd Wgt Sex Del Anes PTL Lv   1 TRM 5/08 [redacted]w[redacted]d   M LTCS Spinal Yes Yes   2 CUR               Past Surgical History: Past Surgical History  Procedure Laterality Date  . Tibia fracture surgery    . Fracture surgery  06/00    left knee surgery, broken tib and fib  . Cesarean section      Fetal distress    Family History: Family History  Problem Relation Age of Onset  . Diabetes Mother   . Hypertension Maternal Uncle     Social History: History  Substance Use Topics  . Smoking status: Never Smoker   . Smokeless tobacco: Never Used  . Alcohol Use: No     Comment: socially    Allergies: No Known Allergies  Meds:  Prescriptions prior to admission  Medication Sig Dispense Refill  . acetaminophen (TYLENOL) 325 MG tablet Take 650 mg by mouth daily as needed for pain. fever      . Fe Fum-FePoly-Vit C-Vit B3 (INTEGRA PO) Take 1 capsule by mouth at bedtime.        ROS: Pertinent findings in history of present illness.  Physical Exam  Blood pressure 102/67, pulse 94,  temperature 98 F (36.7 C), temperature source Oral, resp. rate 20, height 5\' 4"  (1.626 m), weight 82.101 kg (181 lb), last menstrual period 11/12/2011. GENERAL: Well-developed, well-nourished female in no acute distress.  HEENT: normocephalic HEART: normal rate RESP: normal effort ABDOMEN: Soft, non-tender, gravid appropriate for gestational age EXTREMITIES: Nontender, no edema NEURO: alert and oriented SPECULUM EXAM: Deferred     FHT:  Baseline 135, moderate variability, accelerations present, no decelerations Contractions: occasional   Labs: Results for orders placed during the hospital encounter of 07/31/12 (from the past 24 hour(s))  CBC     Status: Abnormal   Collection Time    07/31/12 12:38 PM      Result Value Range   WBC 8.5  4.0 - 10.5 K/uL   RBC 3.46 (*) 3.87 - 5.11 MIL/uL   Hemoglobin 9.7 (*) 12.0 - 15.0 g/dL   HCT 16.1 (*) 09.6 - 04.5 %   MCV 86.4  78.0 - 100.0 fL   MCH 28.0  26.0 - 34.0 pg   MCHC 32.4  30.0 - 36.0 g/dL   RDW 40.9 (*) 81.1 - 91.4 %   Platelets 204  150 - 400 K/uL  COMPREHENSIVE METABOLIC PANEL  Status: Abnormal   Collection Time    07/31/12 12:38 PM      Result Value Range   Sodium 136  135 - 145 mEq/L   Potassium 4.1  3.5 - 5.1 mEq/L   Chloride 103  96 - 112 mEq/L   CO2 24  19 - 32 mEq/L   Glucose, Bld 74  70 - 99 mg/dL   BUN 7  6 - 23 mg/dL   Creatinine, Ser 7.82  0.50 - 1.10 mg/dL   Calcium 8.6  8.4 - 95.6 mg/dL   Total Protein 6.4  6.0 - 8.3 g/dL   Albumin 2.1 (*) 3.5 - 5.2 g/dL   AST 13  0 - 37 U/L   ALT 9  0 - 35 U/L   Alkaline Phosphatase 156 (*) 39 - 117 U/L   Total Bilirubin 0.1 (*) 0.3 - 1.2 mg/dL   GFR calc non Af Amer >90  >90 mL/min   GFR calc Af Amer >90  >90 mL/min    Imaging:    Assessment: 1. Traumatic injury during pregnancy, third trimester   2. Fall, initial encounter     Plan: Called Dr Dion Body to discuss assessment and findings Discharge home Labor precautions and fetal kick counts Continue iron  supplementation and PNV daily F/U in office as scheduled on Thursday Return to MAU as needed    Medication List    ASK your doctor about these medications       acetaminophen 325 MG tablet  Commonly known as:  TYLENOL  Take 650 mg by mouth daily as needed for pain. fever     INTEGRA PO  Take 1 capsule by mouth at bedtime.        Sharen Counter Certified Nurse-Midwife 07/31/2012 2:28 PM

## 2012-07-31 NOTE — Progress Notes (Signed)
Written and verbal d/c instructions given and understanding voiced. Sharen Counter CNM in to discuss u/s results and d/c instructions

## 2012-07-31 NOTE — MAU Note (Signed)
Was at work, was dizzy, fell - tried to catch self, but hit rt side of abd.  Did not pass out.

## 2012-08-01 ENCOUNTER — Other Ambulatory Visit: Payer: Self-pay | Admitting: Obstetrics and Gynecology

## 2012-08-07 ENCOUNTER — Encounter (HOSPITAL_COMMUNITY): Payer: Self-pay | Admitting: Pharmacist

## 2012-08-12 ENCOUNTER — Encounter (HOSPITAL_COMMUNITY): Payer: Self-pay | Admitting: Obstetrics and Gynecology

## 2012-08-12 ENCOUNTER — Inpatient Hospital Stay (HOSPITAL_COMMUNITY)
Admission: AD | Admit: 2012-08-12 | Discharge: 2012-08-14 | DRG: 371 | Disposition: A | Discharge status: Home / Self Care | Payer: BC Managed Care – PPO | Source: Ambulatory Visit | Attending: Obstetrics and Gynecology | Admitting: Obstetrics and Gynecology

## 2012-08-12 ENCOUNTER — Encounter (HOSPITAL_COMMUNITY)
Admission: AD | Disposition: A | Discharge status: Home / Self Care | Payer: Self-pay | Source: Ambulatory Visit | Attending: Obstetrics and Gynecology

## 2012-08-12 ENCOUNTER — Inpatient Hospital Stay (HOSPITAL_COMMUNITY): Payer: BC Managed Care – PPO | Admitting: Anesthesiology

## 2012-08-12 ENCOUNTER — Encounter (HOSPITAL_COMMUNITY): Payer: Self-pay | Admitting: Anesthesiology

## 2012-08-12 DIAGNOSIS — Z98891 History of uterine scar from previous surgery: Secondary | ICD-10-CM

## 2012-08-12 DIAGNOSIS — O34219 Maternal care for unspecified type scar from previous cesarean delivery: Secondary | ICD-10-CM | POA: Diagnosis present

## 2012-08-12 DIAGNOSIS — Z302 Encounter for sterilization: Secondary | ICD-10-CM

## 2012-08-12 LAB — CBC
Platelets: 222 10*3/uL (ref 150–400)
RBC: 3.84 MIL/uL — ABNORMAL LOW (ref 3.87–5.11)
WBC: 8.3 10*3/uL (ref 4.0–10.5)

## 2012-08-12 LAB — TYPE AND SCREEN: Antibody Screen: NEGATIVE

## 2012-08-12 LAB — ABO/RH: ABO/RH(D): O POS

## 2012-08-12 LAB — RPR: RPR Ser Ql: NONREACTIVE

## 2012-08-12 SURGERY — Surgical Case
Anesthesia: Regional | Site: Abdomen | Wound class: Clean Contaminated

## 2012-08-12 MED ORDER — ONDANSETRON HCL 4 MG PO TABS: 4.0000 mg | ORAL_TABLET | ORAL | Status: DC | PRN

## 2012-08-12 MED ORDER — OXYTOCIN 40 UNITS IN LACTATED RINGERS INFUSION - SIMPLE MED
1.0000 m[IU]/min | INTRAVENOUS | Status: DC
  Administered 2012-08-12: 1 m[IU]/min via INTRAVENOUS
  Filled 2012-08-12: qty 1000

## 2012-08-12 MED ORDER — KETOROLAC TROMETHAMINE 30 MG/ML IJ SOLN: 30.0000 mg | Freq: Four times a day (QID) | INTRAMUSCULAR | Status: AC | PRN

## 2012-08-12 MED ORDER — LIDOCAINE HCL (PF) 1 % IJ SOLN: 30.0000 mL | INTRAMUSCULAR | Status: DC | PRN

## 2012-08-12 MED ORDER — FENTANYL CITRATE 0.05 MG/ML IJ SOLN: 25.0000 ug | INTRAMUSCULAR | Status: DC | PRN

## 2012-08-12 MED ORDER — OXYTOCIN 40 UNITS IN LACTATED RINGERS INFUSION - SIMPLE MED: 62.5000 mL/h | INTRAVENOUS | Status: AC

## 2012-08-12 MED ORDER — FLEET ENEMA 7-19 GM/118ML RE ENEM: 1.0000 | ENEMA | RECTAL | Status: DC | PRN

## 2012-08-12 MED ORDER — EPHEDRINE 5 MG/ML INJ: 10.0000 mg | INTRAVENOUS | Status: DC | PRN

## 2012-08-12 MED ORDER — PRENATAL MULTIVITAMIN CH
1.0000 | ORAL_TABLET | Freq: Every day | ORAL | Status: DC
  Filled 2012-08-12 (×2): qty 1

## 2012-08-12 MED ORDER — ACETAMINOPHEN 325 MG PO TABS: 650.0000 mg | ORAL_TABLET | ORAL | Status: DC | PRN

## 2012-08-12 MED ORDER — TERBUTALINE SULFATE 1 MG/ML IJ SOLN: 0.2500 mg | Freq: Once | INTRAMUSCULAR | Status: DC | PRN

## 2012-08-12 MED ORDER — SIMETHICONE 80 MG PO CHEW: 80.0000 mg | CHEWABLE_TABLET | ORAL | Status: DC | PRN

## 2012-08-12 MED ORDER — OXYTOCIN 40 UNITS IN LACTATED RINGERS INFUSION - SIMPLE MED: 62.5000 mL/h | INTRAVENOUS | Status: DC

## 2012-08-12 MED ORDER — DIPHENHYDRAMINE HCL 50 MG/ML IJ SOLN: 12.5000 mg | INTRAMUSCULAR | Status: DC | PRN

## 2012-08-12 MED ORDER — NALOXONE HCL 1 MG/ML IJ SOLN
1.0000 ug/kg/h | INTRAVENOUS | Status: DC | PRN
  Filled 2012-08-12: qty 2

## 2012-08-12 MED ORDER — FERROUS SULFATE 325 (65 FE) MG PO TABS
325.0000 mg | ORAL_TABLET | Freq: Two times a day (BID) | ORAL | Status: DC
  Administered 2012-08-13 – 2012-08-14 (×3): 325 mg via ORAL
  Filled 2012-08-12 (×3): qty 1

## 2012-08-12 MED ORDER — FENTANYL 2.5 MCG/ML BUPIVACAINE 1/10 % EPIDURAL INFUSION (WH - ANES)
14.0000 mL/h | INTRAMUSCULAR | Status: DC | PRN
  Administered 2012-08-12: 14 mL/h via EPIDURAL
  Filled 2012-08-12: qty 125

## 2012-08-12 MED ORDER — OXYCODONE-ACETAMINOPHEN 5-325 MG PO TABS: 1.0000 | ORAL_TABLET | ORAL | Status: DC | PRN

## 2012-08-12 MED ORDER — CEFAZOLIN SODIUM-DEXTROSE 2-3 GM-% IV SOLR
INTRAVENOUS | Status: DC | PRN
  Administered 2012-08-12: 2 g via INTRAVENOUS

## 2012-08-12 MED ORDER — OXYTOCIN BOLUS FROM INFUSION
500.0000 mL | INTRAVENOUS | Status: DC
  Administered 2012-08-12: 500 mL via INTRAVENOUS

## 2012-08-12 MED ORDER — NALBUPHINE SYRINGE 5 MG/0.5 ML
5.0000 mg | INJECTION | INTRAMUSCULAR | Status: DC | PRN
  Filled 2012-08-12: qty 1

## 2012-08-12 MED ORDER — IBUPROFEN 600 MG PO TABS: 600.0000 mg | ORAL_TABLET | Freq: Four times a day (QID) | ORAL | Status: DC | PRN

## 2012-08-12 MED ORDER — PROMETHAZINE HCL 25 MG/ML IJ SOLN: 6.2500 mg | INTRAMUSCULAR | Status: DC | PRN

## 2012-08-12 MED ORDER — MENTHOL 3 MG MT LOZG: 1.0000 | LOZENGE | OROMUCOSAL | Status: DC | PRN

## 2012-08-12 MED ORDER — PHENYLEPHRINE 40 MCG/ML (10ML) SYRINGE FOR IV PUSH (FOR BLOOD PRESSURE SUPPORT)
80.0000 ug | PREFILLED_SYRINGE | INTRAVENOUS | Status: DC | PRN
  Filled 2012-08-12: qty 5

## 2012-08-12 MED ORDER — LIDOCAINE HCL (PF) 1 % IJ SOLN
INTRAMUSCULAR | Status: DC | PRN
  Administered 2012-08-12 (×2): 5 mL

## 2012-08-12 MED ORDER — TETANUS-DIPHTH-ACELL PERTUSSIS 5-2.5-18.5 LF-MCG/0.5 IM SUSP
0.5000 mL | Freq: Once | INTRAMUSCULAR | Status: AC
  Administered 2012-08-13: 0.5 mL via INTRAMUSCULAR

## 2012-08-12 MED ORDER — EPHEDRINE 5 MG/ML INJ
INTRAVENOUS | Status: AC
  Filled 2012-08-12: qty 10

## 2012-08-12 MED ORDER — PHENYLEPHRINE HCL 10 MG/ML IJ SOLN
INTRAMUSCULAR | Status: DC | PRN
  Administered 2012-08-12 (×4): 80 ug via INTRAVENOUS

## 2012-08-12 MED ORDER — WITCH HAZEL-GLYCERIN EX PADS: 1.0000 "application " | MEDICATED_PAD | CUTANEOUS | Status: DC | PRN

## 2012-08-12 MED ORDER — MEPERIDINE HCL 25 MG/ML IJ SOLN: 6.2500 mg | INTRAMUSCULAR | Status: DC | PRN

## 2012-08-12 MED ORDER — SCOPOLAMINE 1 MG/3DAYS TD PT72
1.0000 | MEDICATED_PATCH | Freq: Once | TRANSDERMAL | Status: DC
  Administered 2012-08-12: 1.5 mg via TRANSDERMAL

## 2012-08-12 MED ORDER — MORPHINE SULFATE (PF) 0.5 MG/ML IJ SOLN
INTRAMUSCULAR | Status: DC | PRN
  Administered 2012-08-12: .5 mg via INTRAVENOUS
  Administered 2012-08-12: .5 mg via EPIDURAL

## 2012-08-12 MED ORDER — LACTATED RINGERS IV SOLN
INTRAVENOUS | Status: DC | PRN
  Administered 2012-08-12: 17:00:00 via INTRAVENOUS

## 2012-08-12 MED ORDER — ONDANSETRON HCL 4 MG/2ML IJ SOLN: 4.0000 mg | INTRAMUSCULAR | Status: DC | PRN

## 2012-08-12 MED ORDER — NALOXONE HCL 0.4 MG/ML IJ SOLN: 0.4000 mg | INTRAMUSCULAR | Status: DC | PRN

## 2012-08-12 MED ORDER — CITRIC ACID-SODIUM CITRATE 334-500 MG/5ML PO SOLN
ORAL | Status: AC
  Administered 2012-08-12: 30 mL via ORAL
  Filled 2012-08-12: qty 15

## 2012-08-12 MED ORDER — MIDAZOLAM HCL 2 MG/2ML IJ SOLN: 0.5000 mg | Freq: Once | INTRAMUSCULAR | Status: DC | PRN

## 2012-08-12 MED ORDER — EPHEDRINE 5 MG/ML INJ
10.0000 mg | INTRAVENOUS | Status: DC | PRN
  Filled 2012-08-12: qty 4

## 2012-08-12 MED ORDER — METHYLERGONOVINE MALEATE 0.2 MG/ML IJ SOLN: 0.2000 mg | INTRAMUSCULAR | Status: DC | PRN

## 2012-08-12 MED ORDER — IBUPROFEN 600 MG PO TABS
600.0000 mg | ORAL_TABLET | Freq: Four times a day (QID) | ORAL | Status: DC
  Administered 2012-08-13 – 2012-08-14 (×5): 600 mg via ORAL
  Filled 2012-08-12 (×6): qty 1

## 2012-08-12 MED ORDER — MORPHINE SULFATE 0.5 MG/ML IJ SOLN
INTRAMUSCULAR | Status: AC
  Filled 2012-08-12: qty 10

## 2012-08-12 MED ORDER — LACTATED RINGERS IV SOLN
INTRAVENOUS | Status: DC
  Administered 2012-08-12: 11:00:00 via INTRAVENOUS

## 2012-08-12 MED ORDER — ONDANSETRON HCL 4 MG/2ML IJ SOLN: 4.0000 mg | Freq: Three times a day (TID) | INTRAMUSCULAR | Status: DC | PRN

## 2012-08-12 MED ORDER — KETOROLAC TROMETHAMINE 30 MG/ML IJ SOLN
30.0000 mg | Freq: Four times a day (QID) | INTRAMUSCULAR | Status: AC | PRN
  Administered 2012-08-12: 30 mg via INTRAVENOUS

## 2012-08-12 MED ORDER — DIPHENHYDRAMINE HCL 25 MG PO CAPS
25.0000 mg | ORAL_CAPSULE | ORAL | Status: DC | PRN
  Administered 2012-08-13 – 2012-08-14 (×3): 25 mg via ORAL
  Filled 2012-08-12 (×4): qty 1

## 2012-08-12 MED ORDER — SCOPOLAMINE 1 MG/3DAYS TD PT72
MEDICATED_PATCH | TRANSDERMAL | Status: AC
  Filled 2012-08-12: qty 1

## 2012-08-12 MED ORDER — OXYTOCIN 10 UNIT/ML IJ SOLN
40.0000 [IU] | INTRAVENOUS | Status: DC | PRN
  Administered 2012-08-12: 40 [IU] via INTRAVENOUS

## 2012-08-12 MED ORDER — MORPHINE SULFATE (PF) 0.5 MG/ML IJ SOLN
INTRAMUSCULAR | Status: DC | PRN
  Administered 2012-08-12: 4 mg via EPIDURAL

## 2012-08-12 MED ORDER — LACTATED RINGERS IV SOLN: 500.0000 mL | Freq: Once | INTRAVENOUS | Status: DC

## 2012-08-12 MED ORDER — METHYLERGONOVINE MALEATE 0.2 MG PO TABS: 0.2000 mg | ORAL_TABLET | ORAL | Status: DC | PRN

## 2012-08-12 MED ORDER — SIMETHICONE 80 MG PO CHEW
80.0000 mg | CHEWABLE_TABLET | Freq: Three times a day (TID) | ORAL | Status: DC
  Administered 2012-08-13 – 2012-08-14 (×5): 80 mg via ORAL

## 2012-08-12 MED ORDER — METOCLOPRAMIDE HCL 5 MG/ML IJ SOLN
10.0000 mg | Freq: Three times a day (TID) | INTRAMUSCULAR | Status: DC | PRN
  Administered 2012-08-12: 10 mg via INTRAVENOUS
  Filled 2012-08-12: qty 2

## 2012-08-12 MED ORDER — DIBUCAINE 1 % RE OINT: 1.0000 "application " | TOPICAL_OINTMENT | RECTAL | Status: DC | PRN

## 2012-08-12 MED ORDER — SENNOSIDES-DOCUSATE SODIUM 8.6-50 MG PO TABS
2.0000 | ORAL_TABLET | Freq: Every day | ORAL | Status: DC
  Administered 2012-08-13: 2 via ORAL

## 2012-08-12 MED ORDER — OXYTOCIN 10 UNIT/ML IJ SOLN
INTRAMUSCULAR | Status: AC
  Filled 2012-08-12: qty 3

## 2012-08-12 MED ORDER — DIPHENHYDRAMINE HCL 50 MG/ML IJ SOLN: 25.0000 mg | INTRAMUSCULAR | Status: DC | PRN

## 2012-08-12 MED ORDER — ONDANSETRON HCL 4 MG/2ML IJ SOLN
INTRAMUSCULAR | Status: AC
  Filled 2012-08-12: qty 2

## 2012-08-12 MED ORDER — KETOROLAC TROMETHAMINE 30 MG/ML IJ SOLN
INTRAMUSCULAR | Status: AC
  Filled 2012-08-12: qty 1

## 2012-08-12 MED ORDER — PHENYLEPHRINE 40 MCG/ML (10ML) SYRINGE FOR IV PUSH (FOR BLOOD PRESSURE SUPPORT)
PREFILLED_SYRINGE | INTRAVENOUS | Status: AC
  Filled 2012-08-12: qty 10

## 2012-08-12 MED ORDER — EPHEDRINE SULFATE 50 MG/ML IJ SOLN
INTRAMUSCULAR | Status: DC | PRN
  Administered 2012-08-12 (×2): 10 mg via INTRAVENOUS

## 2012-08-12 MED ORDER — CITRIC ACID-SODIUM CITRATE 334-500 MG/5ML PO SOLN: 30.0000 mL | ORAL | Status: DC | PRN

## 2012-08-12 MED ORDER — LANOLIN HYDROUS EX OINT: 1.0000 "application " | TOPICAL_OINTMENT | CUTANEOUS | Status: DC | PRN

## 2012-08-12 MED ORDER — SODIUM CHLORIDE 0.9 % IR SOLN
Status: DC | PRN
  Administered 2012-08-12: 1000 mL

## 2012-08-12 MED ORDER — ACETAMINOPHEN 10 MG/ML IV SOLN
1000.0000 mg | Freq: Four times a day (QID) | INTRAVENOUS | Status: AC | PRN
  Filled 2012-08-12: qty 100

## 2012-08-12 MED ORDER — MEASLES, MUMPS & RUBELLA VAC ~~LOC~~ INJ
0.5000 mL | INJECTION | Freq: Once | SUBCUTANEOUS | Status: DC
  Filled 2012-08-12: qty 0.5

## 2012-08-12 MED ORDER — ZOLPIDEM TARTRATE 5 MG PO TABS: 5.0000 mg | ORAL_TABLET | Freq: Every evening | ORAL | Status: DC | PRN

## 2012-08-12 MED ORDER — ONDANSETRON HCL 4 MG/2ML IJ SOLN: 4.0000 mg | Freq: Four times a day (QID) | INTRAMUSCULAR | Status: DC | PRN

## 2012-08-12 MED ORDER — SODIUM BICARBONATE 8.4 % IV SOLN
INTRAVENOUS | Status: DC | PRN
  Administered 2012-08-12: 5 mL via EPIDURAL

## 2012-08-12 MED ORDER — PHENYLEPHRINE 40 MCG/ML (10ML) SYRINGE FOR IV PUSH (FOR BLOOD PRESSURE SUPPORT): 80.0000 ug | PREFILLED_SYRINGE | INTRAVENOUS | Status: DC | PRN

## 2012-08-12 MED ORDER — DIPHENHYDRAMINE HCL 25 MG PO CAPS
25.0000 mg | ORAL_CAPSULE | Freq: Four times a day (QID) | ORAL | Status: DC | PRN
  Administered 2012-08-13: 25 mg via ORAL

## 2012-08-12 MED ORDER — LACTATED RINGERS IV SOLN
500.0000 mL | INTRAVENOUS | Status: DC | PRN
  Administered 2012-08-12: 500 mL via INTRAVENOUS

## 2012-08-12 MED ORDER — ONDANSETRON HCL 4 MG/2ML IJ SOLN
INTRAMUSCULAR | Status: DC | PRN
  Administered 2012-08-12: 4 mg via INTRAVENOUS

## 2012-08-12 MED ORDER — SODIUM CHLORIDE 0.9 % IJ SOLN: 3.0000 mL | INTRAMUSCULAR | Status: DC | PRN

## 2012-08-12 MED ORDER — LACTATED RINGERS IV SOLN
INTRAVENOUS | Status: DC
  Administered 2012-08-13: 06:00:00 via INTRAVENOUS

## 2012-08-12 MED ORDER — LACTATED RINGERS IV SOLN
INTRAVENOUS | Status: DC | PRN
  Administered 2012-08-12: 17:00:00 via INTRAVENOUS
  Administered 2012-08-12: 1000 mL

## 2012-08-12 SURGICAL SUPPLY — 40 items
ADH SKN CLS APL DERMABOND .7 (GAUZE/BANDAGES/DRESSINGS)
APL SKNCLS STERI-STRIP NONHPOA (GAUZE/BANDAGES/DRESSINGS) ×1
BARRIER ADHS 3X4 INTERCEED (GAUZE/BANDAGES/DRESSINGS) ×1 IMPLANT
BENZOIN TINCTURE PRP APPL 2/3 (GAUZE/BANDAGES/DRESSINGS) ×1 IMPLANT
BRR ADH 4X3 ABS CNTRL BYND (GAUZE/BANDAGES/DRESSINGS) ×1
CLOTH BEACON ORANGE TIMEOUT ST (SAFETY) ×2 IMPLANT
DERMABOND ADVANCED (GAUZE/BANDAGES/DRESSINGS)
DERMABOND ADVANCED .7 DNX12 (GAUZE/BANDAGES/DRESSINGS) IMPLANT
DRAPE LG THREE QUARTER DISP (DRAPES) ×2 IMPLANT
DRSG OPSITE POSTOP 4X10 (GAUZE/BANDAGES/DRESSINGS) ×2 IMPLANT
DURAPREP 26ML APPLICATOR (WOUND CARE) ×2 IMPLANT
ELECT REM PT RETURN 9FT ADLT (ELECTROSURGICAL) ×2
ELECTRODE REM PT RTRN 9FT ADLT (ELECTROSURGICAL) ×1 IMPLANT
EXTRACTOR VACUUM BELL STYLE (SUCTIONS) IMPLANT
GLOVE BIO SURGEON STRL SZ7 (GLOVE) ×2 IMPLANT
GLOVE BIOGEL PI IND STRL 7.0 (GLOVE) ×1 IMPLANT
GLOVE BIOGEL PI INDICATOR 7.0 (GLOVE) ×2
GOWN PREVENTION PLUS XLARGE (GOWN DISPOSABLE) ×1 IMPLANT
GOWN STRL REIN XL XLG (GOWN DISPOSABLE) ×4 IMPLANT
KIT ABG SYR 3ML LUER SLIP (SYRINGE) IMPLANT
NDL HYPO 25X5/8 SAFETYGLIDE (NEEDLE) IMPLANT
NEEDLE HYPO 25X5/8 SAFETYGLIDE (NEEDLE) IMPLANT
NS IRRIG 1000ML POUR BTL (IV SOLUTION) ×2 IMPLANT
PACK C SECTION WH (CUSTOM PROCEDURE TRAY) ×2 IMPLANT
PAD OB MATERNITY 4.3X12.25 (PERSONAL CARE ITEMS) ×2 IMPLANT
RTRCTR C-SECT PINK 25CM LRG (MISCELLANEOUS) ×2 IMPLANT
SLEEVE SCD COMPRESS KNEE MED (MISCELLANEOUS) IMPLANT
STRIP CLOSURE SKIN 1/2X4 (GAUZE/BANDAGES/DRESSINGS) ×1 IMPLANT
SUT CHROMIC 0 CTX 36 (SUTURE) ×8 IMPLANT
SUT PLAIN 2 0 (SUTURE)
SUT PLAIN 2 0 XLH (SUTURE) ×2 IMPLANT
SUT PLAIN ABS 2-0 54XMFL TIE (SUTURE) IMPLANT
SUT VIC AB 0 CT1 27 (SUTURE) ×4
SUT VIC AB 0 CT1 27XBRD ANBCTR (SUTURE) ×2 IMPLANT
SUT VIC AB 2-0 CT1 27 (SUTURE) ×2
SUT VIC AB 2-0 CT1 TAPERPNT 27 (SUTURE) IMPLANT
SUT VIC AB 4-0 KS 27 (SUTURE) ×2 IMPLANT
TOWEL OR 17X24 6PK STRL BLUE (TOWEL DISPOSABLE) ×6 IMPLANT
TRAY FOLEY CATH 14FR (SET/KITS/TRAYS/PACK) IMPLANT
WATER STERILE IRR 1000ML POUR (IV SOLUTION) ×2 IMPLANT

## 2012-08-12 NOTE — MAU Note (Signed)
Pt reports having ctx q 6-8 min since 6am. Denies vag bleeding or SROM ad reports good fetal movement. 1cm dilated in offiec this week.

## 2012-08-12 NOTE — Brief Op Note (Signed)
08/12/2012  6:17 PM  PATIENT:  Felicia Vasquez  27 y.o. female  PRE-OPERATIVE DIAGNOSIS:  Pregnancy at 38 6/7 weeks, non-reasurring fetal heart tones/desires sterilization, h/o cesarean section, attempted trial of labor  POST-OPERATIVE DIAGNOSIS:  same  PROCEDURE:  Procedure(s): Repeat cesarean section with delivery of baby girl at 48. Apgars 9/9.    Bilateral tubal ligation. (N/A) BTL via Pomeroy method.  SURGEON:  Surgeon(s) and Role:    * Geryl Rankins, MD - Primary  PHYSICIAN ASSISTANT:   ASSISTANTS: Technician   ANESTHESIA:   epidural  EBL:  Total I/O In: 1300 [I.V.:1300] Out: 1150 [Urine:450; Blood:700]  BLOOD ADMINISTERED:none  DRAINS: Urinary Catheter (Foley)   LOCAL MEDICATIONS USED:  NONE  SPECIMEN:  Source of Specimen:  Bilateral fallopian tube segments  DISPOSITION OF SPECIMEN:  PATHOLOGY  COUNTS:  YES  TOURNIQUET:  * No tourniquets in log *  DICTATION: .Other Dictation: Dictation Number [id number]  PLAN OF CARE: To postpartum after PACU  PATIENT DISPOSITION:  PACU - hemodynamically stable.   Delay start of Pharmacological VTE agent (>24hrs) due to surgical blood loss or risk of bleeding: not applicable  FINDINGS:  Viable female infant, APGARS 26,9.  ROA, no nuchal cord, moderate meconium, LUS without rupture, edema of endometrium on right side, White opaque cyst on bilateral fallopian tubes, ovaries not visualized.

## 2012-08-12 NOTE — Anesthesia Preprocedure Evaluation (Signed)
Anesthesia Evaluation  Patient identified by MRN, date of birth, ID band Patient awake    Reviewed: Allergy & Precautions, H&P , Patient's Chart, lab work & pertinent test results  Airway Mallampati: II TM Distance: >3 FB Neck ROM: full    Dental no notable dental hx.    Pulmonary neg pulmonary ROS,  breath sounds clear to auscultation  Pulmonary exam normal       Cardiovascular negative cardio ROS  Rhythm:regular Rate:Normal     Neuro/Psych negative neurological ROS  negative psych ROS   GI/Hepatic negative GI ROS, Neg liver ROS,   Endo/Other  negative endocrine ROS  Renal/GU negative Renal ROS     Musculoskeletal   Abdominal   Peds  Hematology negative hematology ROS (+)   Anesthesia Other Findings scoliosis  Reproductive/Obstetrics (+) Pregnancy                           Anesthesia Physical Anesthesia Plan  ASA: II  Anesthesia Plan: Epidural   Post-op Pain Management:    Induction:   Airway Management Planned:   Additional Equipment:   Intra-op Plan:   Post-operative Plan:   Informed Consent: I have reviewed the patients History and Physical, chart, labs and discussed the procedure including the risks, benefits and alternatives for the proposed anesthesia with the patient or authorized representative who has indicated his/her understanding and acceptance.     Plan Discussed with:   Anesthesia Plan Comments:         Anesthesia Quick Evaluation

## 2012-08-12 NOTE — Op Note (Signed)
NAMEZUMA, HUST NO.:  [id number]  MEDICAL RECORD NO.:  [id number]  LOCATION:  [facility]                          FACILITY:  [facility]  PHYSICIAN:  Pieter Partridge, MD   DATE OF BIRTH:  September 19, 1985  DATE OF PROCEDURE:  08/12/2012 DATE OF DISCHARGE:                              OPERATIVE REPORT   PREOPERATIVE DIAGNOSIS:  Pregnancy at 77 and 6/7th weeks, nonreassuring fetal heart tones and desires sterilization, history of previous cesarean section, attempted trial of labor, and moderate meconium.  POSTOPERATIVE DIAGNOSIS:  Pregnancy at 38 and 6/7th weeks, nonreassuring fetal heart tones and desires sterilization, history of previous cesarean section, attempted trial of labor.  PROCEDURE:  Repeat low transverse cesarean section with 2 layer closure, and bilateral tubal ligation via Pomeroy method.  SURGEON:  Pieter Partridge, MD  ASSISTANT:  Technician.  ANESTHESIA:  Epidural.  ESTIMATED BLOOD LOSS:  700 mL.  URINE OUTPUT:  450 mL of clear urine at end of case.  IV FLUIDS:  1300 mL.  BLOOD ADMINISTERED:  None.  DRAINS:  Foley catheter.  LOCAL:  None.  SPECIMEN:  Bilateral fallopian tube segments.  DISPOSITION:  To pathology to PACU hemodynamically stable.  COMPLICATIONS:  None.  FINDINGS:  Viable female infant, Apgars 9 and 9.  ROA position.  No nuchal cord.  Moderate meconium.  Lower uterine segment without rupture, edema on the right side, edema of the endometrium, small multiple white opaque cysts on the fallopian tubes, and ovaries not visualized.  Uterus otherwise was normal and no adhesions noted.  INDICATIONS:  Ms. Mainwaring is a G2, P1-0-0-1, who presented in labor and was admitted at 3 cm 80% effaced, and -2 station.  She had a previous C- section 5 years ago for arrest of dilatation and fetal distress.  She desired a trial of labor.  IUPC was placed after AROM, which noted moderate meconium.  Fetal status was reassuring.  Her MV use were  100 and she had minimal cervical change.  So Pitocin was started at 1 milliunits.  After the Pitocin was started, the baby had a prolonged deceleration down to the 90s with good return, but afterwards variability was decreased with early decelerations.  Cervix had not changed at that point, and baby was well applied to the cervix.  The patient was counseled on options of close observation moving forward with the knowledge that fetal deceleration potentially may be a sign of impending uterine rupture versus proceeding with a repeat low-transverse cesarean section.  The patient had already opted for a repeat C-section, was scheduled in 3 days.  Because she thought she would not go into labor, so she desired to proceed with repeat low transverse cesarean section.  PROCEDURE IN DETAIL:  Ms. Ricardo was taken to the operating room, where epidural anesthesia was optimized.  The previous incision was marked, and her abdomen was prepped and draped in a normal sterile fashion.  After adequate anesthesia was confirmed, a Pfannenstiel skin incision was made with a scalpel and carried down to the underlying layer of the fascia.  Of note, the subcutaneous tissue probably may be less than 8 cm.  With going down with a scalpel, the fascia was  incised and the rectus muscle on the left was incised, but it was minimally cut and was not actively bleeding.  The fascial incision was then extended laterally with a curved Mayo scissors.  The rectus muscles were dissected sharply off the fascia with the curved Mayo scissors above and below.  The rectus muscles were then separated at the midline and the peritoneum was easily identified and entered sharply with the curved Mayo scissors.  The peritoneal incision was extended little bit and the opening was stretched.  The Alexis retractor was then inserted.  There was a note of some varicosities of the dome of the bladder adjacent to the lower uterine segment.   The bladder had not been injured.  The bladder flat was slightly scarred.  The Russians were used to tent up the vesical uterine peritoneum, and were entered sharply with the Metzenbaum scissors, and a bladder flap was easily made.  A transverse incision was made on the lower uterine segment and extended with the bandage scissors.  The lower uterine segment was thin and tore easily.  When the uterus was opened, a good amount of meconium was noted.  The head was easily delivered through the hysterotomy incision and delivered atraumatically.  Nose and mouth were suctioned.  The baby was spontaneously crying at the hysterotomy incision.  Shoulders were removed and the cord was clamped x2 and cut, and baby placed at the bedside to the awaiting NICU staff.  A segment of cord was clamped for cord pH, which was not necessary because the baby had a very strong cry.  Cord blood was obtained.  The placenta was removed with the aid of fundal massage and it was removed intact.  The uterus was then cleared of all clots and debris with a moist laparotomy sponge.  The hysterotomy incision was then grasped with a ring forceps x3, and it was closed with 0 chromic in a running locked fashion.  A second layer of the same suture was used for imbrication. The hysterotomy incision was hemostatic.  I attempted to exteriorize the uterus to perform the bilateral tubal ligation.  However, it would not deliver through the incision.  The uterus was then tilted midline to perform the left tubal ligation.  The fallopian tubes were identified and followed out to the fimbriated end, and the midportion of the tube.  Babcock's were used to grasp that portion of the tube and 2-0 plain gut was used to ligate the tubes. This was done twice and the mesosalpinx was entered with the Metzenbaum scissors and the fallopian tube segment was transected.  The lumen was cauterized with the Bovie because there was some bleeding of  the serosa of the fallopian tube that was also cauterized.  The same was done on the opposite side in the same fashion.  Those cystic lesions were also present on the fallopian tube.  The tubes were returned to the abdomen.  There was hemostasis of the vesicouterine peritoneum around the lower portion closer to the bladder. There was no active bleeding.  No injuries noted.  The peritoneum was then reapproximated with 2-0 Vicryl in a continuous running fashion, and then the fascia was reapproximated in a continuous fashion with 0 Vicryl.  There were few areas that were open and there was some oozing from previous irrigation, and 2 areas were closed with 0 Vicryl in a single interrupted suture.  Prior to closure during the procedure, the omentum came down into the field that was displaced with  a moist laparotomy sponge and that was removed prior to copious irrigation of the gutters until clear.  Once the lower uterine segment was hemostatic.  Interceed was applied, was placed on the lower uterine segment.  Once the fascia was reapproximated.  The subcutaneous space was copiously irrigated, and she had quite a bit of oozing in several places and subcutaneous tissue, and that was controlled with the Bovie cautery. The skin was then reapproximated with 4-0 Vicryl on a Keith needle. Steri-Strips with benzoin and a pressure dressing were to be applied.  All instrument sponge and needle counts were correct x3.  The patient tolerated the procedure well and the baby remained in the OR suite in stable condition.  She received Ancef prior to the procedure and she had SCDs on and running throughout the procedure.     Pieter Partridge, MD     EBV/MEDQ  D:  08/12/2012  T:  08/12/2012  Job:  [id number]

## 2012-08-12 NOTE — H&P (Signed)
Felicia Vasquez is a 27 y.o. female G2 P1001at 38 6/7 weeks presenting for contractions, every 6-9 min.  Started at 0600 day of admission.  Denies LOF or vaginal bleeding.  Cervix was closed in office 2 days ago.  On arrival to hospital, cervix was 2/70.  After ambulation x 1 hour, 3/80.  Pt states she vomited twice this am before onset of contractions.  Pt has a h/o hyperemesis, which continued well into the second trimester.  She reports she has still had n/v for the past 3 weeks, which she does not consider atypical. Pt with a h/o primary LTCS with 2 layer closure 5 years ago due to arrest of dilatation and fetal distress.  Baby was 8 pounds, 12 ounces.  An ultrasound was done ~10 days ago; EFW at that time was 7 pounds, 6 ounces (79%).  Pt has desired to VBAC since the beginning of pregnancy.  She opted for repeat c-section if she did not go into spontaneous labor, which was scheduled for  3/26.  Pt was counseled on repeat c-section at preop appt in office 2 days ago.  Maternal Medical History:  Reason for admission: Contractions and nausea.   Contractions: Onset was 6-12 hours ago.   Frequency: regular.   Perceived severity is strong.    Fetal activity: Perceived fetal activity is normal.   Last perceived fetal movement was within the past hour.    Prenatal complications: Hyperemesis, gastroenteritis  Prenatal Complications - Diabetes: none.    OB History   Grav Para Term Preterm Abortions TAB SAB Ect Mult Living   2 1 1       1      History reviewed. No pertinent past medical history. Past Surgical History  Procedure Laterality Date  . Tibia fracture surgery    . Fracture surgery  06/00    left knee surgery, broken tib and fib  . Cesarean section      Fetal distress   Family History: family history includes Diabetes in her mother and Hypertension in her maternal uncle. Social History:  reports that she has never smoked. She has never used smokeless tobacco. She reports that  she does not drink alcohol or use illicit drugs.   Prenatal Transfer Tool  Maternal Diabetes: No Genetic Screening: Normal Maternal Ultrasounds/Referrals: Normal Fetal Ultrasounds or other Referrals:  None Maternal Substance Abuse:  No Significant Maternal Medications:  None Significant Maternal Lab Results:  Lab values include: Group B Strep negative Other Comments:  Desires VBAC, moderate meconium  Review of Systems  Constitutional: Negative for fever and chills.  Respiratory: Negative for shortness of breath.   Gastrointestinal: Positive for nausea and vomiting.  Neurological: Negative for dizziness and headaches.   5/80/-2.  AROM moderate meconium, mucus plug with scant blood. IUPC placed posteriorly without difficulty.  Dilation: 3 Effacement (%): 80 Station: -2 Exam by:: J. Rasch RN  Blood pressure 103/64, pulse 79, temperature 98 F (36.7 C), temperature source Oral, resp. rate 20, height 5\' 4"  (1.626 m), weight 83.734 kg (184 lb 9.6 oz), last menstrual period 11/12/2011, SpO2 100.00%. Maternal Exam:  Uterine Assessment: Contraction frequency is regular.   Abdomen: Surgical scars: low transverse.   Estimated fetal weight is 8 lbs..   Fetal presentation: vertex  Introitus: Normal vulva. Ferning test: not done.  Nitrazine test: not done. Amniotic fluid character: meconium stained. Moderate meconium  Pelvis: adequate for delivery.   Cervix: Cervix evaluated by digital exam.     Fetal Exam Fetal Monitor  Review: Mode: fetoscope.   Baseline rate: 130s.  Variability: moderate (6-25 bpm).   Pattern: accelerations present and early decelerations.    Fetal State Assessment: Category I - tracings are normal.     Physical Exam  Constitutional: She is oriented to person, place, and time. She appears well-developed and well-nourished. No distress.  HENT:  Head: Normocephalic and atraumatic.  Eyes: EOM are normal.  Neck: Normal range of motion.  Cardiovascular:  Normal rate, regular rhythm and normal heart sounds.   Respiratory: Effort normal and breath sounds normal. No respiratory distress.  GI: There is no tenderness.  No pain over Pfannenstiel incision.  Musculoskeletal: She exhibits edema. She exhibits no tenderness.  Neurological: She is alert and oriented to person, place, and time.  Skin: Skin is warm and dry. She is not diaphoretic.  Psychiatric: She has a normal mood and affect. Her behavior is normal.    Prenatal labs: ABO, Rh:   Antibody:   Rubella:   RPR:    HBsAg:    HIV:    GBS: Negative (02/27 0000)   Assessment/Plan: IUP @ 38 6/7 weeks, Active labor. Desires VBAC.  Pt counseled on R/B/A including but not limited to fetal distress, emergency c-section and fetal death.  VBAC consent signed.  Epidural and IUPC in place. Expectant management.     Geryl Rankins 08/12/2012, 1:04 PM

## 2012-08-12 NOTE — Anesthesia Procedure Notes (Signed)
Epidural Patient location during procedure: OB Start time: 08/12/2012 12:02 PM  Staffing Anesthesiologist: Angus Seller., Harrell Gave. Performed by: anesthesiologist   Preanesthetic Checklist Completed: patient identified, site marked, surgical consent, pre-op evaluation, timeout performed, IV checked, risks and benefits discussed and monitors and equipment checked  Epidural Patient position: sitting Prep: site prepped and draped and DuraPrep Patient monitoring: continuous pulse ox and blood pressure Approach: midline Injection technique: LOR air and LOR saline  Needle:  Needle type: Tuohy  Needle gauge: 17 G Needle length: 9 cm and 9 Needle insertion depth: 5 cm cm Catheter type: closed end flexible Catheter size: 19 Gauge Catheter at skin depth: 10 cm Test dose: negative  Assessment Events: blood not aspirated, injection not painful, no injection resistance, negative IV test and no paresthesia  Additional Notes Patient identified.  Risk benefits discussed including failed block, incomplete pain control, headache, nerve damage, paralysis, blood pressure changes, nausea, vomiting, reactions to medication both toxic or allergic, and postpartum back pain.  Patient expressed understanding and wished to proceed.  All questions were answered.  Sterile technique used throughout procedure and epidural site dressed with sterile barrier dressing. No paresthesia or other complications noted.The patient did not experience any signs of intravascular injection such as tinnitus or metallic taste in mouth nor signs of intrathecal spread such as rapid motor block. Please see nursing notes for vital signs.

## 2012-08-12 NOTE — Anesthesia Postprocedure Evaluation (Signed)
  Anesthesia Post Note  Patient: Felicia Vasquez  Procedure(s) Performed: Procedure(s) (LRB): Repeat cesarean section with delivery of baby girl at 74. Apgars 9/9.    Bilateral tubal ligation. (N/A)  Anesthesia type: Epidural  Patient location: PACU  Post pain: Pain level controlled  Post assessment: Post-op Vital signs reviewed  Last Vitals:  Filed Vitals:   08/12/12 1607  BP:   Pulse: 81  Temp:   Resp:     Post vital signs: Reviewed  Level of consciousness: awake  Complications: No apparent anesthesia complications

## 2012-08-12 NOTE — Progress Notes (Signed)
Baby was placed skin to skin with FOB after delivery. FOB held her for approximately 15 minutes, before leaving the OR to use the bathroom. RN offered to place baby skin to skin with Mom, however CRNA requested not to skin to skin, because " the procedure was almost over". Baby was then held cheek to cheek with mom until FOB returned. FOB then held her in blanket until the end of procedure. Baby was placed skin to skin with mom before transferring to PACU.

## 2012-08-12 NOTE — Progress Notes (Signed)
Felicia Vasquez is a 27 y.o. G2P1001 at [redacted]w[redacted]d   Subjective: Pt without complaints.  Feeling some pressure. MVUs 100 in the last hour per RN. Objective: BP 103/62  Pulse 72  Temp(Src) 97.9 F (36.6 C) (Oral)  Resp 12  Ht 5\' 4"  (1.626 m)  Wt 83.734 kg (184 lb 9.6 oz)  BMI 31.67 kg/m2  SpO2 99%  LMP 11/12/2011     FHT:  Reactive, good variability, occ deceleration UC:   irregular, every 1-4 minutes SVE:   Dilation: 6 Effacement (%): 70 Station: -2 Exam by:: Dr. Dion Vasquez  Labs: Lab Results  Component Value Date   WBC 8.3 08/12/2012   HGB 10.7* 08/12/2012   HCT 32.3* 08/12/2012   MCV 84.1 08/12/2012   PLT 222 08/12/2012    Assessment / Plan: Spontaneous labor, h/o previous c-section, TOL Low MVU, minimal cervical change.  Recommend augmentation of labor with Pitocin.  Start 1 mU and increase by 1-2 based on MVUs.. Fetal status reassuring. No signs of uterine rupture.  Preeclampsia:  no signs or symptoms of toxicity Fetal Wellbeing:  Category I Pain Control:  Epidural I/D:  n/a Anticipated MOD:  VBAC  Felicia Vasquez 08/12/2012, 3:01 PM

## 2012-08-12 NOTE — OR Nursing (Addendum)
Foley catheter in place upon arrival to OR. Urine color- clear. Uterus massaged by S. Layal Javid Charity fundraiser. Two tubes of cord blood sent to lab. 12cc of blood evacuated from uterus during uterine massage.

## 2012-08-12 NOTE — Progress Notes (Signed)
Patient ID: Felicia Vasquez, female   DOB: [birth year], 26 y.o.   MRN: [id number] Called by RN for fetal deceleration x 5 minutes.  Pitocin on 1 mU. Pt comfortable without complaints. VSS Cvx: 6-7 cm/80/0 station.  Well applied to cervix.  No vaginal bleeding noted. EM:  Reactive prior to decel.  Spontaneous decel down to the 90s x 5 minutes.  Variability good during decel.  After recovery, variability decreased and early decelerations noted. A/P:  TOLAC with prolonged fetal deceleration. Offered very watchful observation but may result in an emergency c-section vs. Proceeding with repeat c-section.  Pt desires repeat c-section with BTL.  R/B/A of both procedures discussed.  Consent signed.

## 2012-08-13 ENCOUNTER — Encounter (HOSPITAL_COMMUNITY): Payer: Self-pay | Admitting: Anesthesiology

## 2012-08-13 ENCOUNTER — Other Ambulatory Visit (HOSPITAL_COMMUNITY): Payer: BC Managed Care – PPO

## 2012-08-13 LAB — CBC
Platelets: 182 10*3/uL (ref 150–400)
RDW: 15.4 % (ref 11.5–15.5)
WBC: 7.5 10*3/uL (ref 4.0–10.5)

## 2012-08-13 NOTE — Anesthesia Postprocedure Evaluation (Signed)
  Anesthesia Post-op Note  Patient: Felicia Vasquez  Procedure(s) Performed: Procedure(s): CESAREAN SECTION (N/A)  Patient Location: Mother/Baby  Anesthesia Type:Epidural  Level of Consciousness: awake  Airway and Oxygen Therapy: Patient Spontanous Breathing  Post-op Pain: none  Post-op Assessment: Patient's Cardiovascular Status Stable, Respiratory Function Stable, Patent Airway, No signs of Nausea or vomiting, Adequate PO intake, Pain level controlled, No headache, No backache, No residual numbness and No residual motor weakness  Post-op Vital Signs: Reviewed and stable  Complications: No apparent anesthesia complications

## 2012-08-13 NOTE — Progress Notes (Signed)
Patient ID: Felicia Vasquez, female   DOB: Mar 13, 1986, 27 y.o.   MRN: [id number]  Spoke with RN.  Pt doing well postop. VS and labs reviewed remotely.   Will round late afternoon.

## 2012-08-13 NOTE — Progress Notes (Signed)
Subjective: Postpartum Day 1: Cesarean Delivery Patient reports she is doing well.  Pain controlled.  Ambulating well.  +Bottle feeding.  Desires early discharge.  Baby doing well.   Objective: Vital signs in last 24 hours: Temp:  [97.6 F (36.4 C)-98.6 F (37 C)] 97.6 F (36.4 C) (03/24 1145) Pulse Rate:  [60-98] 84 (03/24 1145) Resp:  [18-20] 18 (03/24 1145) BP: (93-124)/(58-79) 93/59 mmHg (03/24 1145) SpO2:  [96 %-100 %] 97 % (03/24 1145)  Physical Exam:  General: alert, cooperative and no distress.  Ambulating well Lochia: appropriate Uterine Fundus: firm Incision: Pressure dressing soiled on right side DVT Evaluation: No evidence of DVT seen on physical exam. Calf/Ankle edema is present.   Recent Labs  08/12/12 1118 08/13/12 0615  HGB 10.7* 9.1*  HCT 32.3* 27.8*    Assessment/Plan: Status post Cesarean section. Doing well postoperatively.  Continue current care. Remove pressure dressing this evening. If pt continues to do well, discharge in am. Geryl Rankins 08/13/2012, 6:40 PM

## 2012-08-13 NOTE — Transfer of Care (Signed)
Immediate Anesthesia Transfer of Care Note  Patient: Felicia Vasquez  Procedure(s) Performed: Procedure(s): Repeat cesarean section with delivery of baby girl at 56. Apgars 9/9.    Bilateral tubal ligation. (N/A)  Patient Location: PACU  Anesthesia Type:Epidural  Level of Consciousness: awake, alert  and oriented  Airway & Oxygen Therapy: Patient Spontanous Breathing and Patient connected to nasal cannula oxygen  Post-op Assessment: Report given to PACU RN  Post vital signs: Reviewed and stable  Complications: No apparent anesthesia complications

## 2012-08-13 NOTE — Progress Notes (Signed)
Ur chart review completed per request.  

## 2012-08-14 ENCOUNTER — Encounter (HOSPITAL_COMMUNITY): Payer: Self-pay | Admitting: Obstetrics and Gynecology

## 2012-08-14 MED ORDER — IBUPROFEN 600 MG PO TABS: 600.0000 mg | ORAL_TABLET | Freq: Four times a day (QID) | ORAL | Status: DC

## 2012-08-14 MED ORDER — OXYCODONE-ACETAMINOPHEN 5-325 MG PO TABS: 1.0000 | ORAL_TABLET | ORAL | Status: DC | PRN

## 2012-08-14 MED ORDER — PRENATAL MULTIVITAMIN CH: 1.0000 | ORAL_TABLET | Freq: Every day | ORAL | Status: DC

## 2012-08-14 NOTE — Discharge Summary (Signed)
Obstetric Discharge Summary Reason for Admission: onset of labor Prenatal Procedures: ultrasound Intrapartum Procedures: cesarean: low cervical, transverse, attempted VBAC Postpartum Procedures: BTL at time of c-section Complications-Operative and Postpartum: none Hemoglobin  Date Value Range Status  08/13/2012 9.1* 12.0 - 15.0 g/dL Final     HCT  Date Value Range Status  08/13/2012 27.8* 36.0 - 46.0 % Final    Physical Exam:  General: alert, cooperative and no distress Lochia: appropriate Uterine Fundus: firm Incision: healing well, no significant drainage DVT Evaluation: No evidence of DVT seen on physical exam. Calf/Ankle edema is present.  Discharge Diagnoses: Term Pregnancy-delivered  Discharge Information: Date: 08/14/2012 Activity: pelvic rest Diet: routine Medications: PNV, Ibuprofen, Iron and Percocet Condition: stable Instructions: See discharge instructions. Discharge to: home Follow-up Information   Follow up with Geryl Rankins, MD. Schedule an appointment as soon as possible for a visit in 2 weeks. (Post-op/incision check)    Contact information:   [address], [address] [place] [address] [phone]       Newborn Data: Live born female  Birth Weight: 7 lb 11 oz (3487 g) APGAR: 9, 9  Home with mother.  Geryl Rankins 08/14/2012, 8:50 AM

## 2012-08-15 ENCOUNTER — Encounter (HOSPITAL_COMMUNITY): Admission: RE | Payer: Self-pay | Source: Ambulatory Visit

## 2012-08-15 ENCOUNTER — Inpatient Hospital Stay (HOSPITAL_COMMUNITY)
Admission: RE | Admit: 2012-08-15 | Payer: BC Managed Care – PPO | Source: Ambulatory Visit | Admitting: Obstetrics and Gynecology

## 2012-08-15 SURGERY — Surgical Case
Anesthesia: Regional

## 2012-09-26 ENCOUNTER — Other Ambulatory Visit: Payer: Self-pay | Admitting: Obstetrics and Gynecology

## 2012-09-26 ENCOUNTER — Other Ambulatory Visit (HOSPITAL_COMMUNITY)
Admission: RE | Admit: 2012-09-26 | Discharge: 2012-09-26 | Disposition: A | Discharge status: Home / Self Care | Payer: BC Managed Care – PPO | Source: Ambulatory Visit | Attending: Obstetrics and Gynecology | Admitting: Obstetrics and Gynecology

## 2012-09-26 DIAGNOSIS — Z01419 Encounter for gynecological examination (general) (routine) without abnormal findings: Secondary | ICD-10-CM | POA: Insufficient documentation

## 2013-06-19 ENCOUNTER — Other Ambulatory Visit: Payer: Self-pay | Admitting: Obstetrics and Gynecology

## 2013-06-19 DIAGNOSIS — N644 Mastodynia: Secondary | ICD-10-CM

## 2013-06-27 ENCOUNTER — Inpatient Hospital Stay: Admission: RE | Admit: 2013-06-27 | Payer: BC Managed Care – PPO | Source: Ambulatory Visit

## 2014-03-24 ENCOUNTER — Encounter (HOSPITAL_COMMUNITY): Payer: Self-pay | Admitting: Obstetrics and Gynecology

## 2014-07-21 ENCOUNTER — Ambulatory Visit (INDEPENDENT_AMBULATORY_CARE_PROVIDER_SITE_OTHER): Payer: 59

## 2014-07-21 ENCOUNTER — Ambulatory Visit (INDEPENDENT_AMBULATORY_CARE_PROVIDER_SITE_OTHER): Payer: 59 | Admitting: Family Medicine

## 2014-07-21 VITALS — BP 100/68 | HR 100 | Temp 98.4°F | Resp 18 | Ht 65.0 in | Wt 184.0 lb

## 2014-07-21 DIAGNOSIS — R079 Chest pain, unspecified: Secondary | ICD-10-CM | POA: Diagnosis not present

## 2014-07-21 DIAGNOSIS — R0602 Shortness of breath: Secondary | ICD-10-CM

## 2014-07-21 LAB — POCT CBC
Granulocyte percent: 71.3 %G (ref 37–80)
HCT, POC: 34.5 % — AB (ref 37.7–47.9)
Hemoglobin: 11 g/dL — AB (ref 12.2–16.2)
Lymph, poc: 2.1 (ref 0.6–3.4)
MCH, POC: 27.8 pg (ref 27–31.2)
MCHC: 31.9 g/dL (ref 31.8–35.4)
MCV: 87.3 fL (ref 80–97)
MID (cbc): 0.4 (ref 0–0.9)
MPV: 6.4 fL (ref 0–99.8)
POC Granulocyte: 6.3 (ref 2–6.9)
POC LYMPH PERCENT: 24 %L (ref 10–50)
POC MID %: 4.7 %M (ref 0–12)
Platelet Count, POC: 344 10*3/uL (ref 142–424)
RBC: 3.95 M/uL — AB (ref 4.04–5.48)
RDW, POC: 15 %
WBC: 8.8 10*3/uL (ref 4.6–10.2)

## 2014-07-21 IMAGING — CR DG CHEST 2V
2 series · 2 of 2 positions shown · non-contrast
Comparison: None.

CLINICAL DATA: Chest pain, shortness of Breath

EXAM:
CHEST  2 VIEW

[PA]
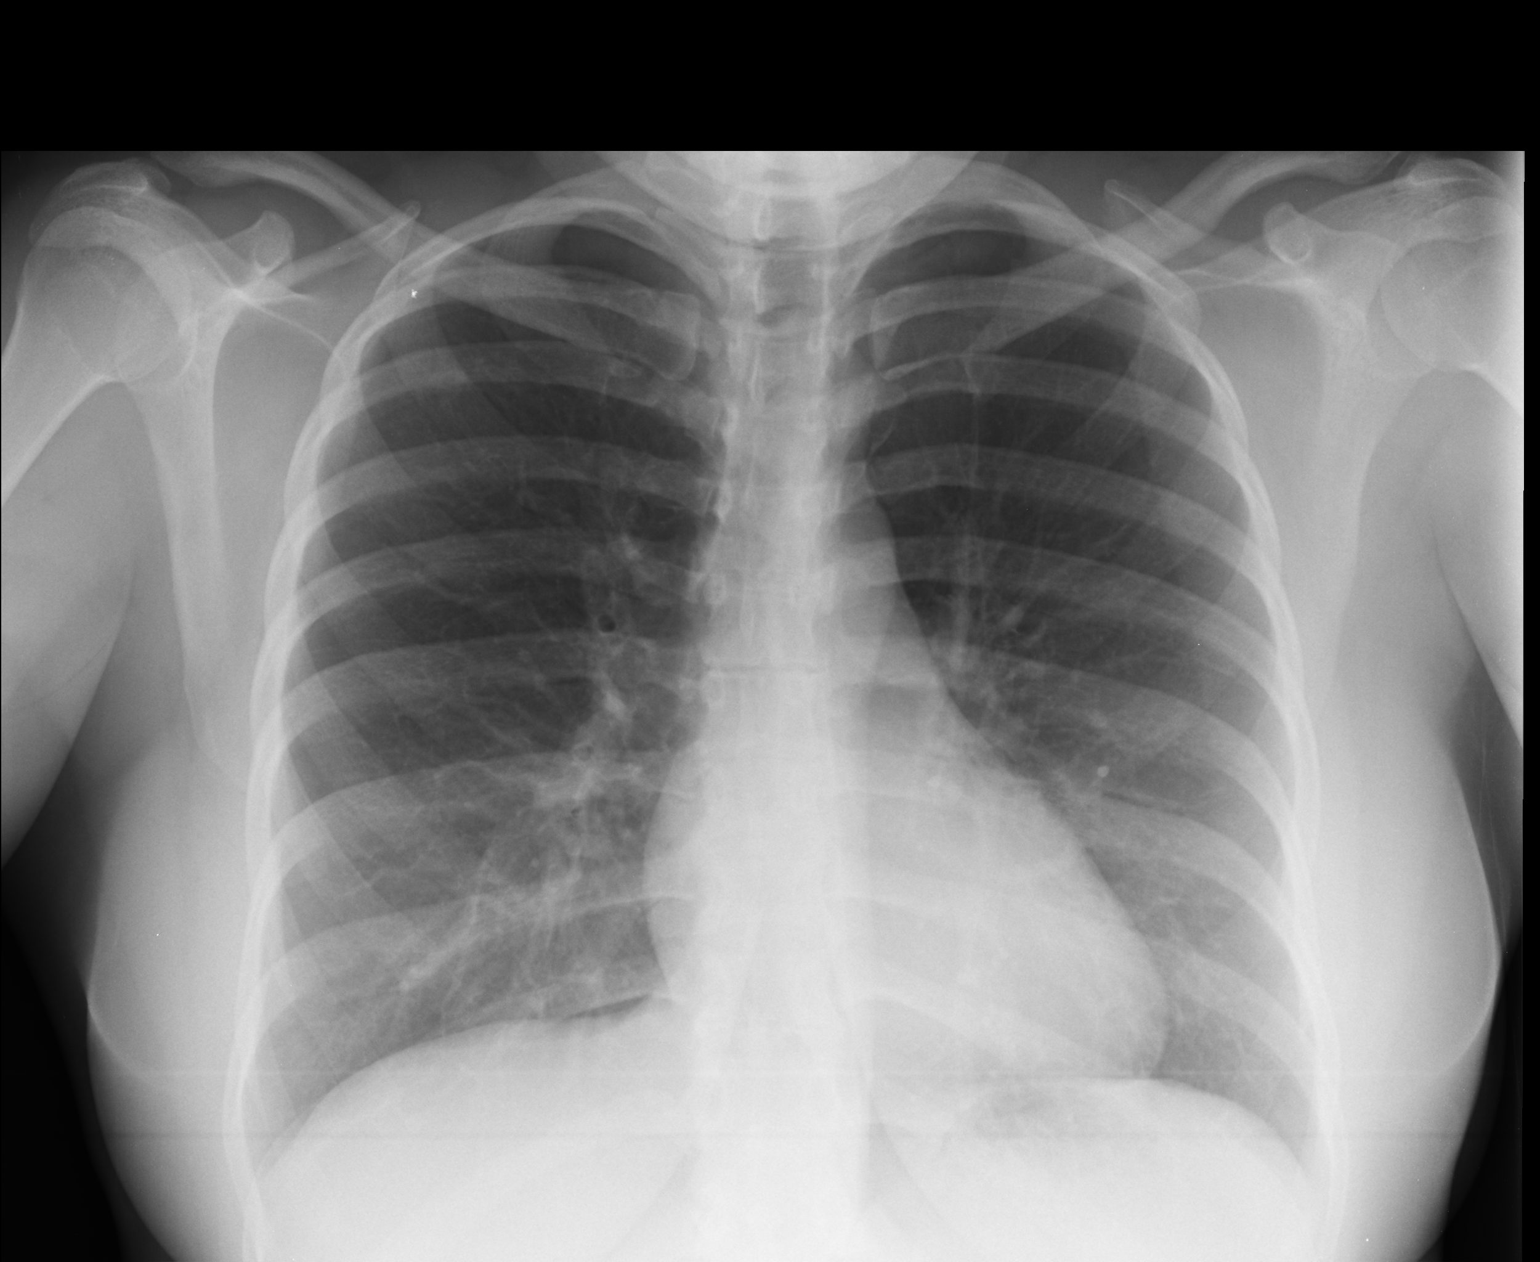

[lateral]
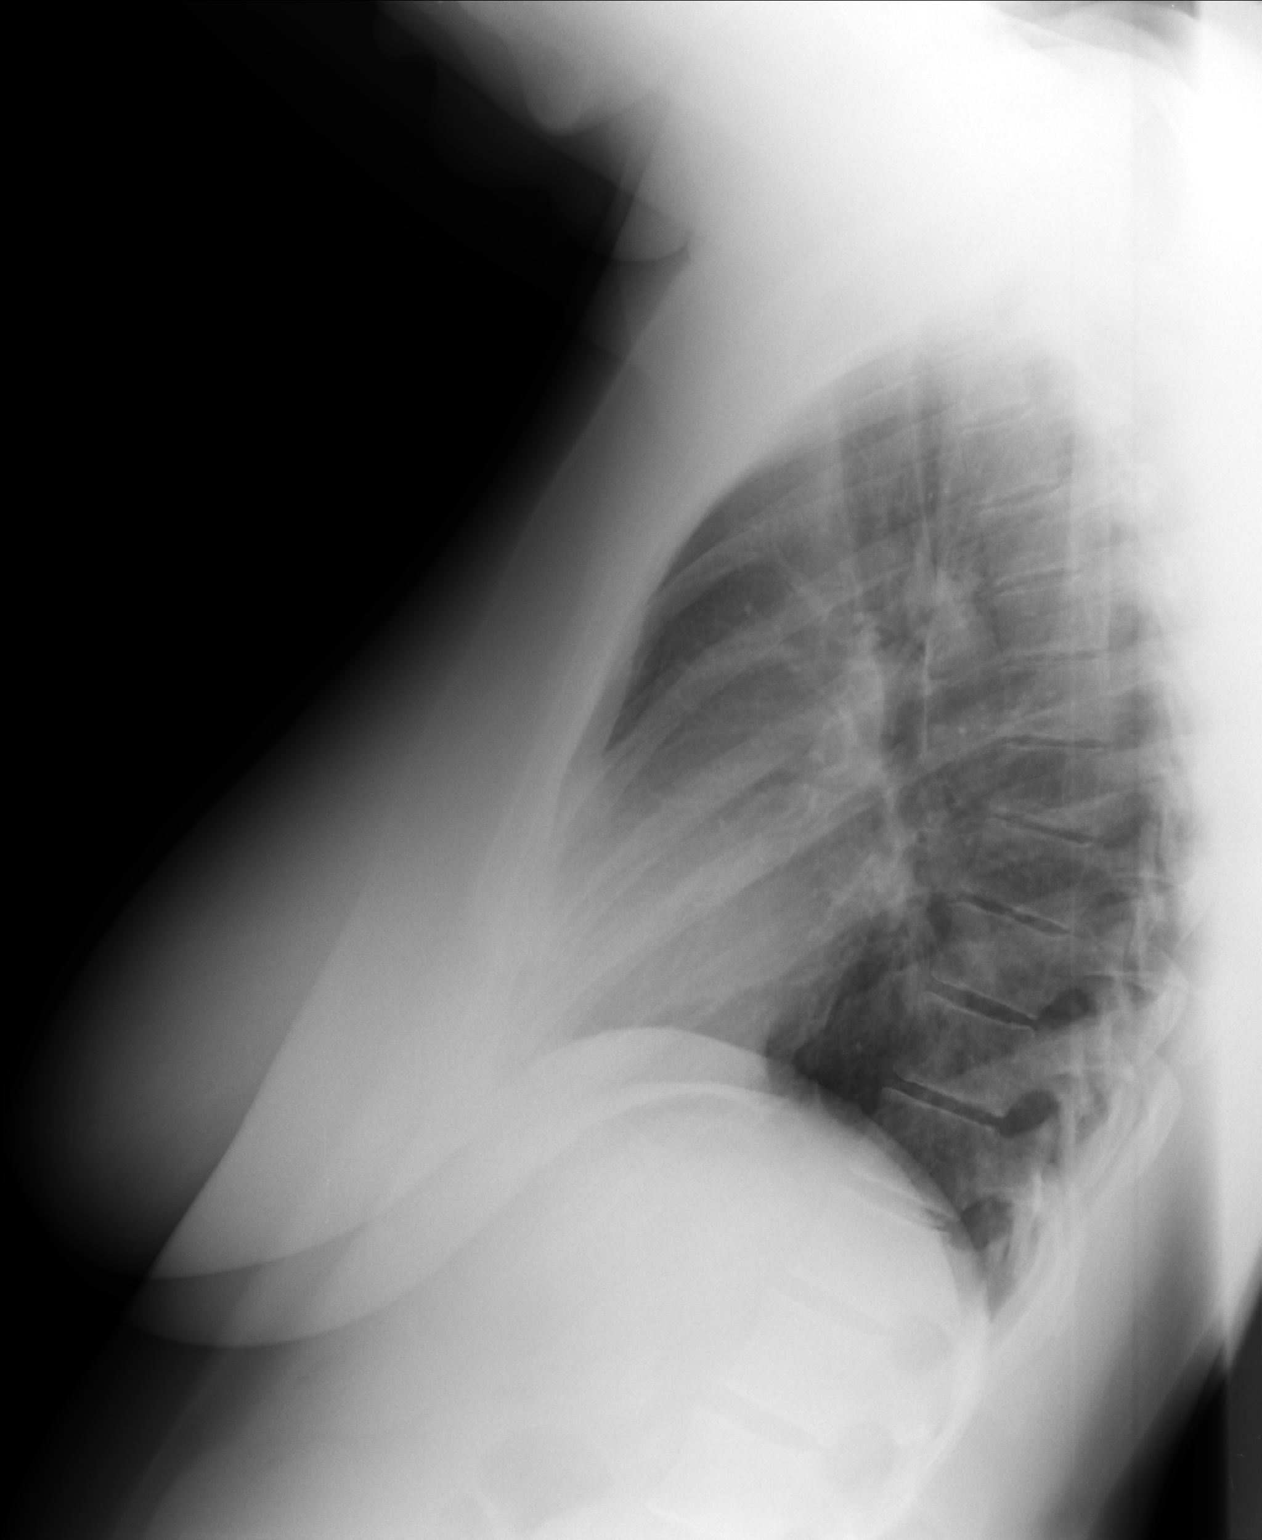

[2 of 2 positions shown; findings below may reference images not displayed]

FINDINGS: Cardiomediastinal silhouette is unremarkable. No acute infiltrate or
pleural effusion. No pulmonary edema. Bony thorax is unremarkable.
IMPRESSION: No active cardiopulmonary disease.

## 2014-07-21 MED ORDER — DICLOFENAC SODIUM 75 MG PO TBEC: 75.0000 mg | DELAYED_RELEASE_TABLET | Freq: Two times a day (BID) | ORAL | Status: DC

## 2014-07-21 NOTE — Patient Instructions (Signed)

## 2014-07-21 NOTE — Progress Notes (Signed)
This chart was scribed for Felicia Sidle, MD by Luisa Dago, ED Scribe. This patient was seen in room 5 and the patient's care was started at 7:53 PM.  Patient ID: Felicia Vasquez MRN: [id number], DOB: [birth year], 29 y.o. Date of Encounter: 07/21/2014, 7:51 PM  Primary Physician: No primary care provider on file.  Chief Complaint:  Chief Complaint  Patient presents with   Chest Pain    x 2 days     HPI: 29 y.o. year old female who works for [employer] with personal medical history below presents to the office complaining of sudden onset SOB that started 2 days ago. Pt states that she's been having a difficult time catching her breath. She also endorses associated chest pain, which she describes as "sharp" in nature. Pt denies any hx of asthma. She denies fever, neck pain, sore throat, visual disturbance, CP, cough, abdominal pain, nausea, emesis, diarrhea, urinary symptoms, back pain, HA, weakness, numbness and rash as associated symptoms.    History reviewed. No pertinent past medical history.   Home Meds: Prior to Admission medications   Medication Sig Start Date End Date Taking? Authorizing Provider  Fe Fum-FePoly-Vit C-Vit B3 (INTEGRA PO) Take 1 capsule by mouth at bedtime.    Historical Provider, MD  ibuprofen (ADVIL,MOTRIN) 600 MG tablet Take 1 tablet (600 mg total) by mouth every 6 (six) hours. Patient not taking: Reported on 07/21/2014 08/14/12   Geryl Rankins, MD  oxyCODONE-acetaminophen (PERCOCET/ROXICET) 5-325 MG per tablet Take 1-2 tablets by mouth every 4 (four) hours as needed. Patient not taking: Reported on 07/21/2014 08/14/12   Geryl Rankins, MD  Prenatal Vit-Fe Fumarate-FA (PRENATAL MULTIVITAMIN) TABS Take 1 tablet by mouth daily at 12 noon. Patient not taking: Reported on 07/21/2014 08/14/12   Geryl Rankins, MD    Allergies: No Known Allergies  History   Social History   Marital Status: Married    Spouse Name: N/A   Number of Children: N/A    Years of Education: N/A   Occupational History   Not on file.   Social History Main Topics   Smoking status: Never Smoker    Smokeless tobacco: Never Used   Alcohol Use: No     Comment: socially   Drug Use: No   Sexual Activity: Yes   Other Topics Concern   Not on file   Social History Narrative     Review of Systems: Positive SOB Constitutional: negative for chills, fever, night sweats, weight changes, or fatigue  HEENT: negative for vision changes, hearing loss, congestion, rhinorrhea, ST, epistaxis, or sinus pressure Cardiovascular: negative for chest pain or palpitations Respiratory: negative for hemoptysis, wheezing, or cough Abdominal: negative for abdominal pain, nausea, vomiting, diarrhea, or constipation Dermatological: negative for rash Neurologic: negative for headache, dizziness, or syncope All other systems reviewed and are otherwise negative with the exception to those above and in the HPI.   Physical Exam:  Blood pressure 100/68, pulse 100, temperature 98.4 F (36.9 C), temperature source Oral, resp. rate 18, height  (1.651 m), weight 184 lb (83.462 kg), last menstrual period 07/04/2014, SpO2 98 %, unknown if currently breastfeeding., Body mass index is 30.62 kg/(m^2). General: Well developed, well nourished, in no acute distress. Head: Normocephalic, atraumatic, eyes without discharge, sclera non-icteric, nares are without discharge. Bilateral auditory canals clear, TM's are without perforation, pearly grey and translucent with reflective cone of light bilaterally. Oral cavity moist, posterior pharynx without exudate, erythema, peritonsillar abscess, or post nasal drip.  Neck: Supple.  No thyromegaly. Full ROM. No lymphadenopathy. Lungs: Clear bilaterally to auscultation without wheezes, rales, or rhonchi. Breathing is unlabored. Heart: RRR with S1 S2. No murmurs, rubs, or gallops appreciated. Abdomen: Soft, non-tender, non-distended with normoactive  bowel sounds. No hepatomegaly. No rebound/guarding. No obvious abdominal masses. Msk:  Strength and tone normal for age. Extremities/Skin: Warm and dry. No clubbing or cyanosis. No edema. No rashes or suspicious lesions. Neuro: Alert and oriented X 3. Moves all extremities spontaneously. Gait is normal. CNII-XII grossly in tact. Psych:  Responds to questions appropriately with a normal affect.   Labs: Results for orders placed or performed in visit on 07/21/14  POCT CBC  Result Value Ref Range   WBC 8.8 4.6 - 10.2 K/uL   Lymph, poc 2.1 0.6 - 3.4   POC LYMPH PERCENT 24.0 10 - 50 %L   MID (cbc) 0.4 0 - 0.9   POC MID % 4.7 0 - 12 %M   POC Granulocyte 6.3 2 - 6.9   Granulocyte percent 71.3 37 - 80 %G   RBC 3.95 (A) 4.04 - 5.48 M/uL   Hemoglobin 11.0 (A) 12.2 - 16.2 g/dL   HCT, POC 34.734.5 (A) 42.537.7 - 47.9 %   MCV 87.3 80 - 97 fL   MCH, POC 27.8 27 - 31.2 pg   MCHC 31.9 31.8 - 35.4 g/dL   RDW, POC 95.615.0 %   Platelet Count, POC 344 142 - 424 K/uL   MPV 6.4 0 - 99.8 fL   [facility] reading (PRIMARY) by  Dr. Milus GlazierLauenstein:  CXR->.negative  EKG:  NSR   ASSESSMENT AND PLAN:  29 y.o. year old female with atypical chest pain This chart was scribed in my presence and reviewed by me personally.    ICD-9-CM ICD-10-CM   1. Chest pain, unspecified chest pain type 786.50 R07.9 EKG 12-Lead     DG Chest 2 View     POCT CBC     D-dimer, quantitative     DG Chest 2 View     diclofenac (VOLTAREN) 75 MG EC tablet  2. Shortness of breath 786.05 R06.02 DG Chest 2 View     POCT CBC     D-dimer, quantitative     DG Chest 2 View     Signed, Felicia SidleKurt Lauenstein, MD     Signed, Felicia SidleKurt Lauenstein, MD 07/21/2014 7:51 PM

## 2014-07-22 LAB — D-DIMER, QUANTITATIVE: D-Dimer, Quant: 0.86 ug/mL-FEU — ABNORMAL HIGH (ref 0.00–0.48)

## 2014-11-07 ENCOUNTER — Other Ambulatory Visit: Payer: Self-pay | Admitting: Obstetrics and Gynecology

## 2014-11-07 ENCOUNTER — Other Ambulatory Visit (HOSPITAL_COMMUNITY)
Admission: RE | Admit: 2014-11-07 | Discharge: 2014-11-07 | Disposition: A | Discharge status: Home / Self Care | Payer: 59 | Source: Ambulatory Visit | Attending: Obstetrics and Gynecology | Admitting: Obstetrics and Gynecology

## 2014-11-07 DIAGNOSIS — Z1151 Encounter for screening for human papillomavirus (HPV): Secondary | ICD-10-CM | POA: Diagnosis present

## 2014-11-07 DIAGNOSIS — Z01419 Encounter for gynecological examination (general) (routine) without abnormal findings: Secondary | ICD-10-CM | POA: Insufficient documentation

## 2014-11-11 LAB — CYTOLOGY - PAP

## 2016-06-23 ENCOUNTER — Encounter: Payer: Self-pay | Admitting: Family Medicine

## 2016-06-24 ENCOUNTER — Encounter: Payer: Self-pay | Admitting: Family Medicine

## 2016-06-24 ENCOUNTER — Ambulatory Visit (INDEPENDENT_AMBULATORY_CARE_PROVIDER_SITE_OTHER): Payer: 59 | Admitting: Family Medicine

## 2016-06-24 ENCOUNTER — Encounter: Payer: 59 | Admitting: Family Medicine

## 2016-06-24 VITALS — BP 104/60 | HR 73 | Ht 65.0 in | Wt 182.4 lb

## 2016-06-24 DIAGNOSIS — Z0000 Encounter for general adult medical examination without abnormal findings: Secondary | ICD-10-CM | POA: Diagnosis not present

## 2016-06-24 LAB — CBC WITH DIFFERENTIAL/PLATELET
Basophils Absolute: 0 cells/uL (ref 0–200)
Basophils Relative: 0 %
Eosinophils Absolute: 61 cells/uL (ref 15–500)
Eosinophils Relative: 1 %
HEMATOCRIT: 32.4 % — AB (ref 35.0–45.0)
Hemoglobin: 10.5 g/dL — ABNORMAL LOW (ref 11.7–15.5)
LYMPHS ABS: 1525 {cells}/uL (ref 850–3900)
LYMPHS PCT: 25 %
MCH: 27.6 pg (ref 27.0–33.0)
MCHC: 32.4 g/dL (ref 32.0–36.0)
MCV: 85 fL (ref 80.0–100.0)
MONO ABS: 427 {cells}/uL (ref 200–950)
MPV: 8.4 fL (ref 7.5–12.5)
Monocytes Relative: 7 %
Neutro Abs: 4087 cells/uL (ref 1500–7800)
Neutrophils Relative %: 67 %
Platelets: 367 10*3/uL (ref 140–400)
RBC: 3.81 MIL/uL (ref 3.80–5.10)
RDW: 14.5 % (ref 11.0–15.0)
WBC: 6.1 10*3/uL (ref 4.0–10.5)

## 2016-06-24 LAB — COMPREHENSIVE METABOLIC PANEL
ALK PHOS: 72 U/L (ref 33–115)
ALT: 12 U/L (ref 6–29)
AST: 14 U/L (ref 10–30)
Albumin: 4 g/dL (ref 3.6–5.1)
BILIRUBIN TOTAL: 0.3 mg/dL (ref 0.2–1.2)
BUN: 12 mg/dL (ref 7–25)
CALCIUM: 9 mg/dL (ref 8.6–10.2)
CO2: 28 mmol/L (ref 20–31)
CREATININE: 0.75 mg/dL (ref 0.50–1.10)
Chloride: 102 mmol/L (ref 98–110)
GLUCOSE: 91 mg/dL (ref 65–99)
Potassium: 4.3 mmol/L (ref 3.5–5.3)
Sodium: 136 mmol/L (ref 135–146)
Total Protein: 7.1 g/dL (ref 6.1–8.1)

## 2016-06-24 LAB — LIPID PANEL
Cholesterol: 149 mg/dL (ref <200)
HDL: 31 mg/dL — ABNORMAL LOW (ref >50)
LDL CALC: 100 mg/dL — AB (ref <100)
TRIGLYCERIDES: 90 mg/dL (ref <150)
Total CHOL/HDL Ratio: 4.8 Ratio (ref <5.0)
VLDL: 18 mg/dL (ref <30)

## 2016-06-24 LAB — POCT URINALYSIS DIPSTICK
Bilirubin, UA: NEGATIVE
GLUCOSE UA: NEGATIVE
Ketones, UA: NEGATIVE
NITRITE UA: NEGATIVE
Spec Grav, UA: 1.02
UROBILINOGEN UA: NEGATIVE
pH, UA: 7.5

## 2016-06-24 LAB — TSH: TSH: 1.23 mIU/L

## 2016-06-24 NOTE — Patient Instructions (Addendum)
Eat a healthy well balanced diet and get at least 150 minutes of physical activity per week.  We will call you with your lab results.   Do monthly self breat exams. You will need to start getting mammograms earlier than age 32 due to family history of breast cancer. You can discuss this with your OB/GYN as to how soon to start.   Breast Self-Awareness Introduction Breast self-awareness means being familiar with how your breasts look and feel. It involves checking your breasts regularly and reporting any changes to your health care provider. Practicing breast self-awareness is important. A change in your breasts can be a sign of a serious medical problem. Being familiar with how your breasts look and feel allows you to find any problems early, when treatment is more likely to be successful. All women should practice breast self-awareness, including women who have had breast implants. How to do a breast self-exam One way to learn what is normal for your breasts and whether your breasts are changing is to do a breast self-exam. To do a breast self-exam: Look for Changes  1. Remove all the clothing above your waist. 2. Stand in front of a mirror in a room with good lighting. 3. Put your hands on your hips. 4. Push your hands firmly downward. 5. Compare your breasts in the mirror. Look for differences between them (asymmetry), such as:  Differences in shape.  Differences in size.  Puckers, dips, and bumps in one breast and not the other. 6. Look at each breast for changes in your skin, such as:  Redness.  Scaly areas. 7. Look for changes in your nipples, such as:  Discharge.  Bleeding.  Dimpling.  Redness.  A change in position. Feel for Changes  Carefully feel your breasts for lumps and changes. It is best to do this while lying on your back on the floor and again while sitting or standing in the shower or tub with soapy water on your skin. Feel each breast in the following  way:  Place the arm on the side of the breast you are examining above your head.  Feel your breast with the other hand.  Start in the nipple area and make  inch (2 cm) overlapping circles to feel your breast. Use the pads of your three middle fingers to do this. Apply light pressure, then medium pressure, then firm pressure. The light pressure will allow you to feel the tissue closest to the skin. The medium pressure will allow you to feel the tissue that is a little deeper. The firm pressure will allow you to feel the tissue close to the ribs.  Continue the overlapping circles, moving downward over the breast until you feel your ribs below your breast.  Move one finger-width toward the center of the body. Continue to use the  inch (2 cm) overlapping circles to feel your breast as you move slowly up toward your collarbone.  Continue the up and down exam using all three pressures until you reach your armpit. Write Down What You Find  Write down what is normal for each breast and any changes that you find. Keep a written record with breast changes or normal findings for each breast. By writing this information down, you do not need to depend only on memory for size, tenderness, or location. Write down where you are in your menstrual cycle, if you are still menstruating. If you are having trouble noticing differences in your breasts, do not get discouraged. With  time you will become more familiar with the variations in your breasts and more comfortable with the exam. How often should I examine my breasts? Examine your breasts every month. If you are breastfeeding, the best time to examine your breasts is after a feeding or after using a breast pump. If you menstruate, the best time to examine your breasts is 5-7 days after your period is over. During your period, your breasts are lumpier, and it may be more difficult to notice changes. When should I see my health care provider? See your health care  provider if you notice:  A change in shape or size of your breasts or nipples.  A change in the skin of your breast or nipples, such as a reddened or scaly area.  Unusual discharge from your nipples.  A lump or thick area that was not there before.  Pain in your breasts.  Anything that concerns you. This information is not intended to replace advice given to you by your health care provider. Make sure you discuss any questions you have with your health care provider. Document Released: 05/09/2005 Document Revised: 10/15/2015 Document Reviewed: 03/29/2015  2017 Elsevier    Preventative Care for Adults - Female      MAINTAIN REGULAR HEALTH EXAMS:  A routine yearly physical is a good way to check in with your primary care provider about your health and preventive screening. It is also an opportunity to share updates about your health and any concerns you have, and receive a thorough all-over exam.   Most health insurance companies pay for at least some preventative services.  Check with your health plan for specific coverages.  WHAT PREVENTATIVE SERVICES DO WOMEN NEED?  Adult women should have their weight and blood pressure checked regularly.   Women age 57 and older should have their cholesterol levels checked regularly.  Women should be screened for cervical cancer with a Pap smear and pelvic exam beginning at either age 43, or 3 years after they become sexually activity.    Breast cancer screening generally begins at age 26 with a mammogram and breast exam by your primary care provider.    Beginning at age 27 and continuing to age 60, women should be screened for colorectal cancer.  Certain people may need continued testing until age 35.  Updating vaccinations is part of preventative care.  Vaccinations help protect against diseases such as the flu.  Osteoporosis is a disease in which the bones lose minerals and strength as we age. Women ages 31 and over should discuss this  with their caregivers, as should women after menopause who have other risk factors.  Lab tests are generally done as part of preventative care to screen for anemia and blood disorders, to screen for problems with the kidneys and liver, to screen for bladder problems, to check blood sugar, and to check your cholesterol level.  Preventative services generally include counseling about diet, exercise, avoiding tobacco, drugs, excessive alcohol consumption, and sexually transmitted infections.    GENERAL RECOMMENDATIONS FOR GOOD HEALTH:  Healthy diet:  Eat a variety of foods, including fruit, vegetables, animal or vegetable protein, such as meat, fish, chicken, and eggs, or beans, lentils, tofu, and grains, such as rice.  Drink plenty of water daily.  Decrease saturated fat in the diet, avoid lots of red meat, processed foods, sweets, fast foods, and fried foods.  Exercise:  Aerobic exercise helps maintain good heart health. At least 30-40 minutes of moderate-intensity exercise is recommended. For  example, a brisk walk that increases your heart rate and breathing. This should be done on most days of the week.   Find a type of exercise or a variety of exercises that you enjoy so that it becomes a part of your daily life.  Examples are running, walking, swimming, water aerobics, and biking.  For motivation and support, explore group exercise such as aerobic class, spin class, Zumba, Yoga,or  martial arts, etc.    Set exercise goals for yourself, such as a certain weight goal, walk or run in a race such as a 5k walk/run.  Speak to your primary care provider about exercise goals.  Disease prevention:  If you smoke or chew tobacco, find out from your caregiver how to quit. It can literally save your life, no matter how long you have been a tobacco user. If you do not use tobacco, never begin.   Maintain a healthy diet and normal weight. Increased weight leads to problems with blood pressure and  diabetes.   The Body Mass Index or BMI is a way of measuring how much of your body is fat. Having a BMI above 27 increases the risk of heart disease, diabetes, hypertension, stroke and other problems related to obesity. Your caregiver can help determine your BMI and based on it develop an exercise and dietary program to help you achieve or maintain this important measurement at a healthful level.  High blood pressure causes heart and blood vessel problems.  Persistent high blood pressure should be treated with medicine if weight loss and exercise do not work.   Fat and cholesterol leaves deposits in your arteries that can block them. This causes heart disease and vessel disease elsewhere in your body.  If your cholesterol is found to be high, or if you have heart disease or certain other medical conditions, then you may need to have your cholesterol monitored frequently and be treated with medication.   Ask if you should have a cardiac stress test if your history suggests this. A stress test is a test done on a treadmill that looks for heart disease. This test can find disease prior to there being a problem.  Menopause can be associated with physical symptoms and risks. Hormone replacement therapy is available to decrease these. You should talk to your caregiver about whether starting or continuing to take hormones is right for you.   Osteoporosis is a disease in which the bones lose minerals and strength as we age. This can result in serious bone fractures. Risk of osteoporosis can be identified using a bone density scan. Women ages 2965 and over should discuss this with their caregivers, as should women after menopause who have other risk factors. Ask your caregiver whether you should be taking a calcium supplement and Vitamin D, to reduce the rate of osteoporosis.   Avoid drinking alcohol in excess (more than two drinks per day).  Avoid use of street drugs. Do not share needles with anyone. Ask for  professional help if you need assistance or instructions on stopping the use of alcohol, cigarettes, and/or drugs.  Brush your teeth twice a day with fluoride toothpaste, and floss once a day. Good oral hygiene prevents tooth decay and gum disease. The problems can be painful, unattractive, and can cause other health problems. Visit your dentist for a routine oral and dental check up and preventive care every 6-12 months.   Look at your skin regularly.  Use a mirror to look at your back. Notify  your caregivers of changes in moles, especially if there are changes in shapes, colors, a size larger than a pencil eraser, an irregular border, or development of new moles.  Safety:  Use seatbelts 100% of the time, whether driving or as a passenger.  Use safety devices such as hearing protection if you work in environments with loud noise or significant background noise.  Use safety glasses when doing any work that could send debris in to the eyes.  Use a helmet if you ride a bike or motorcycle.  Use appropriate safety gear for contact sports.  Talk to your caregiver about gun safety.  Use sunscreen with a SPF (or skin protection factor) of 15 or greater.  Lighter skinned people are at a greater risk of skin cancer. Don't forget to also wear sunglasses in order to protect your eyes from too much damaging sunlight. Damaging sunlight can accelerate cataract formation.   Practice safe sex. Use condoms. Condoms are used for birth control and to help reduce the spread of sexually transmitted infections (or STIs).  Some of the STIs are gonorrhea (the clap), chlamydia, syphilis, trichomonas, herpes, HPV (human papilloma virus) and HIV (human immunodeficiency virus) which causes AIDS. The herpes, HIV and HPV are viral illnesses that have no cure. These can result in disability, cancer and death.   Keep carbon monoxide and smoke detectors in your home functioning at all times. Change the batteries every 6 months or use  a model that plugs into the wall.   Vaccinations:  Stay up to date with your tetanus shots and other required immunizations. You should have a booster for tetanus every 10 years. Be sure to get your flu shot every year, since 5%-20% of the [place] population comes down with the flu. The flu vaccine changes each year, so being vaccinated once is not enough. Get your shot in the fall, before the flu season peaks.   Other vaccines to consider:  Human Papilloma Virus or HPV causes cancer of the cervix, and other infections that can be transmitted from person to person. There is a vaccine for HPV, and females should get immunized between the ages of 82 and 8. It requires a series of 3 shots.   Pneumococcal vaccine to protect against certain types of pneumonia.  This is normally recommended for adults age 78 or older.  However, adults younger than 32 years old with certain underlying conditions such as diabetes, heart or lung disease should also receive the vaccine.  Shingles vaccine to protect against Varicella Zoster if you are older than age 24, or younger than 32 years old with certain underlying illness.  Hepatitis A vaccine to protect against a form of infection of the liver by a virus acquired from food.  Hepatitis B vaccine to protect against a form of infection of the liver by a virus acquired from blood or body fluids, particularly if you work in health care.  If you plan to travel internationally, check with your local health department for specific vaccination recommendations.  Cancer Screening:  Breast cancer screening is essential to preventive care for women. All women age 79 and older should perform a breast self-exam every month. At age 22 and older, women should have their caregiver complete a breast exam each year. Women at ages 15 and older should have a mammogram (x-ray film) of the breasts. Your caregiver can discuss how often you need mammograms.    Cervical cancer screening  includes taking a Pap smear (sample of cells  examined under a microscope) from the cervix (end of the uterus). It also includes testing for HPV (Human Papilloma Virus, which can cause cervical cancer). Screening and a pelvic exam should begin at age 8, or 3 years after a woman becomes sexually active. Screening should occur every year, with a Pap smear but no HPV testing, up to age 46. After age 29, you should have a Pap smear every 3 years with HPV testing, if no HPV was found previously.   Most routine colon cancer screening begins at the age of 69. On a yearly basis, doctors may provide special easy to use take-home tests to check for hidden blood in the stool. Sigmoidoscopy or colonoscopy can detect the earliest forms of colon cancer and is life saving. These tests use a small camera at the end of a tube to directly examine the colon. Speak to your caregiver about this at age 73, when routine screening begins (and is repeated every 5 years unless early forms of pre-cancerous polyps or small growths are found).

## 2016-06-24 NOTE — Progress Notes (Signed)
Subjective:    Patient ID: Felicia Vasquez, female    DOB: [birth year], 31 y.o.   MRN: [id number]  HPI Chief Complaint  Patient presents with  . new pt    new pt cpe, no other concerns   She is new to the practice and here for a complete physical exam. Previous medical care: [facility].  Last CPE: years  Other providers: OB/GYN  Past medical history:  History of anemia- takes iron OTC occasionally.   Breast cancer in 2 maternal aunt diagnosed in 7640s and states one passed away from this. She questions when she should start mammograms.   Social history: Lives with husband and 2 kids, works with [employer], works from home.  Denies smoking, drinks alcohol socially, denies drug use  Diet: nothing particular Excerise: none  Immunizations: flu shot- refused. Tdap up to date.   Health maintenance:  Mammogram: N/A Colonoscopy: N/A Last Pap smear: 10/2014 and normal, HPV - Pregnancies: 2 Contraception: tubal Last Dental Exam: every 6 months.  Last Eye Exam: last year. Prescription glasses.   Wears seatbelt always, smoke detectors in home and functioning, does not text while driving and feels safe in home environment.   Reviewed allergies, medications, past medical, surgical, family, and social history.    Review of Systems Review of Systems Constitutional: -fever, -chills, -sweats, -unexpected weight change,+fatigue ENT: -runny nose, -ear pain, -sore throat Cardiology:  -chest pain, -palpitations, -edema Respiratory: -cough, -shortness of breath, -wheezing Gastroenterology: -abdominal pain, -nausea, -vomiting, -diarrhea, -constipation  Hematology: -bleeding or bruising problems Musculoskeletal: -arthralgias, -myalgias, -joint swelling, -back pain Ophthalmology: -vision changes Urology: -dysuria, -difficulty urinating, -hematuria, -urinary frequency, -urgency Neurology: -headache, -weakness, -tingling, -numbness        Objective:   Physical Exam BP  104/60   Pulse 73   Ht 5\' 5"  (1.651 m)   Wt 182 lb 6.4 oz (82.7 kg)   LMP 06/08/2016   Breastfeeding? No   BMI 30.35 kg/m   General Appearance:    Alert, cooperative, no distress, appears stated age  Head:    Normocephalic, without obvious abnormality, atraumatic  Eyes:    PERRL, conjunctiva/corneas clear, EOM's intact, fundi    benign  Ears:    Normal TM's and external ear canals  Nose:   Nares normal, mucosa normal, no drainage or sinus   tenderness  Throat:   Lips, mucosa, and tongue normal; teeth and gums normal  Neck:   Supple, no lymphadenopathy;  thyroid:  no   enlargement/tenderness/nodules; no carotid   bruit or JVD  Back:    Spine nontender, no curvature, ROM normal, no CVA     tenderness  Lungs:     Clear to auscultation bilaterally without wheezes, rales or     ronchi; respirations unlabored  Chest Wall:    No tenderness or deformity   Heart:    Regular rate and rhythm, S1 and S2 normal, no murmur, rub   or gallop  Breast Exam:    No tenderness, masses, or nipple discharge or inversion.      No axillary lymphadenopathy  Abdomen:     Soft, non-tender, nondistended, normoactive bowel sounds,    no masses, no hepatosplenomegaly  Genitalia:    Not done, has OB/GYN and pap smear up to date.   Rectal:    Not performed due to age<40 and no related complaints  Extremities:   No clubbing, cyanosis or edema  Pulses:   2+ and symmetric all extremities  Skin:  Skin color, texture, turgor normal, no rashes or lesions  Lymph nodes:   Cervical, supraclavicular, and axillary nodes normal  Neurologic:   CNII-XII intact, normal strength, sensation and gait; reflexes 2+ and symmetric throughout          Psych:   Normal mood, affect, hygiene and grooming.     Urinalysis dipstick: trace leuk, trace blood, trace protein      Assessment & Plan:  Routine general medical examination at a health care facility - Plan: Urinalysis Dipstick  She appears to be doing well.  Discussed healthy  lifestyle with diet and exercise.  Advised to do monthly self breast exams.  She will follow up with OB/GYN and has appointment scheduled.  Immunizations are up to date except flu shot and she refuses.  Will check labs and follow up in 1 year unless she needs [place] sooner.

## 2018-01-29 ENCOUNTER — Other Ambulatory Visit: Payer: Self-pay | Admitting: Obstetrics and Gynecology

## 2018-01-29 ENCOUNTER — Other Ambulatory Visit (HOSPITAL_COMMUNITY)
Admission: RE | Admit: 2018-01-29 | Discharge: 2018-01-29 | Disposition: A | Discharge status: Home / Self Care | Payer: 59 | Source: Ambulatory Visit | Attending: Obstetrics and Gynecology | Admitting: Obstetrics and Gynecology

## 2018-01-29 DIAGNOSIS — Z01411 Encounter for gynecological examination (general) (routine) with abnormal findings: Secondary | ICD-10-CM | POA: Diagnosis present

## 2018-01-31 LAB — CYTOLOGY - PAP
Diagnosis: NEGATIVE
HPV (WINDOPATH): NOT DETECTED

## 2018-03-15 ENCOUNTER — Ambulatory Visit: Payer: 59 | Admitting: Registered"

## 2019-09-16 ENCOUNTER — Ambulatory Visit: Payer: 59 | Attending: Internal Medicine

## 2019-09-16 DIAGNOSIS — Z20822 Contact with and (suspected) exposure to covid-19: Secondary | ICD-10-CM

## 2019-09-17 LAB — SARS-COV-2, NAA 2 DAY TAT

## 2019-09-17 LAB — NOVEL CORONAVIRUS, NAA: SARS-CoV-2, NAA: NOT DETECTED

## 2020-06-15 ENCOUNTER — Other Ambulatory Visit: Payer: 59
